# Patient Record
Sex: Female | Born: 1975 | Race: White | Hispanic: No | Marital: Married | Smoking: Never smoker
Health system: Southern US, Community
[De-identification: ages and names within clinical notes are randomized; demographics above are authoritative.]

## PROBLEM LIST (undated history)

## (undated) DIAGNOSIS — F32A Depression, unspecified: Secondary | ICD-10-CM

## (undated) DIAGNOSIS — R7611 Nonspecific reaction to tuberculin skin test without active tuberculosis: Secondary | ICD-10-CM

## (undated) DIAGNOSIS — F419 Anxiety disorder, unspecified: Secondary | ICD-10-CM

## (undated) DIAGNOSIS — T7840XA Allergy, unspecified, initial encounter: Secondary | ICD-10-CM

## (undated) DIAGNOSIS — F329 Major depressive disorder, single episode, unspecified: Secondary | ICD-10-CM

## (undated) DIAGNOSIS — K589 Irritable bowel syndrome without diarrhea: Secondary | ICD-10-CM

## (undated) DIAGNOSIS — U071 COVID-19: Secondary | ICD-10-CM

## (undated) DIAGNOSIS — O1490 Unspecified pre-eclampsia, unspecified trimester: Secondary | ICD-10-CM

## (undated) DIAGNOSIS — Q059 Spina bifida, unspecified: Secondary | ICD-10-CM

## (undated) DIAGNOSIS — E162 Hypoglycemia, unspecified: Secondary | ICD-10-CM

## (undated) DIAGNOSIS — K219 Gastro-esophageal reflux disease without esophagitis: Secondary | ICD-10-CM

## (undated) DIAGNOSIS — R7612 Nonspecific reaction to cell mediated immunity measurement of gamma interferon antigen response without active tuberculosis: Secondary | ICD-10-CM

## (undated) DIAGNOSIS — J45909 Unspecified asthma, uncomplicated: Secondary | ICD-10-CM

## (undated) DIAGNOSIS — E785 Hyperlipidemia, unspecified: Secondary | ICD-10-CM

## (undated) HISTORY — DX: Depression, unspecified: F32.A

## (undated) HISTORY — DX: Unspecified pre-eclampsia, unspecified trimester: O14.90

## (undated) HISTORY — DX: Gastro-esophageal reflux disease without esophagitis: K21.9

## (undated) HISTORY — DX: Hypoglycemia, unspecified: E16.2

## (undated) HISTORY — DX: Allergy, unspecified, initial encounter: T78.40XA

## (undated) HISTORY — DX: Anxiety disorder, unspecified: F41.9

## (undated) HISTORY — DX: Unspecified asthma, uncomplicated: J45.909

## (undated) HISTORY — DX: Hyperlipidemia, unspecified: E78.5

## (undated) HISTORY — DX: Nonspecific reaction to cell mediated immunity measurement of gamma interferon antigen response without active tuberculosis: R76.12

## (undated) HISTORY — DX: Major depressive disorder, single episode, unspecified: F32.9

## (undated) HISTORY — PX: OTHER SURGICAL HISTORY: SHX169

## (undated) HISTORY — PX: DILATION AND CURETTAGE OF UTERUS: SHX78

## (undated) HISTORY — DX: COVID-19: U07.1

---

## 1998-03-23 ENCOUNTER — Ambulatory Visit (HOSPITAL_COMMUNITY): Admission: RE | Admit: 1998-03-23 | Discharge: 1998-03-23 | Payer: Self-pay | Admitting: Orthopaedic Surgery

## 2002-06-13 HISTORY — PX: DILATION AND CURETTAGE OF UTERUS: SHX78

## 2003-09-24 ENCOUNTER — Ambulatory Visit (HOSPITAL_COMMUNITY): Admission: RE | Admit: 2003-09-24 | Discharge: 2003-09-24 | Payer: Self-pay | Admitting: Obstetrics and Gynecology

## 2003-11-20 ENCOUNTER — Other Ambulatory Visit: Admission: RE | Admit: 2003-11-20 | Discharge: 2003-11-20 | Payer: Self-pay | Admitting: Obstetrics and Gynecology

## 2004-04-02 ENCOUNTER — Inpatient Hospital Stay (HOSPITAL_COMMUNITY): Admission: AD | Admit: 2004-04-02 | Discharge: 2004-04-03 | Payer: Self-pay | Admitting: Obstetrics and Gynecology

## 2004-05-14 ENCOUNTER — Inpatient Hospital Stay (HOSPITAL_COMMUNITY): Admission: AD | Admit: 2004-05-14 | Discharge: 2004-05-14 | Payer: Self-pay | Admitting: Obstetrics and Gynecology

## 2004-05-19 ENCOUNTER — Inpatient Hospital Stay (HOSPITAL_COMMUNITY): Admission: AD | Admit: 2004-05-19 | Discharge: 2004-05-22 | Payer: Self-pay | Admitting: Obstetrics and Gynecology

## 2004-05-19 ENCOUNTER — Encounter (INDEPENDENT_AMBULATORY_CARE_PROVIDER_SITE_OTHER): Payer: Self-pay | Admitting: Specialist

## 2004-05-23 ENCOUNTER — Encounter: Admission: RE | Admit: 2004-05-23 | Discharge: 2004-05-23 | Payer: Self-pay | Admitting: Obstetrics and Gynecology

## 2004-12-12 ENCOUNTER — Other Ambulatory Visit: Admission: RE | Admit: 2004-12-12 | Discharge: 2004-12-12 | Payer: Self-pay | Admitting: Obstetrics and Gynecology

## 2006-01-22 ENCOUNTER — Other Ambulatory Visit: Admission: RE | Admit: 2006-01-22 | Discharge: 2006-01-22 | Payer: Self-pay | Admitting: Obstetrics and Gynecology

## 2006-06-19 HISTORY — PX: OTHER SURGICAL HISTORY: SHX169

## 2010-04-12 ENCOUNTER — Encounter (INDEPENDENT_AMBULATORY_CARE_PROVIDER_SITE_OTHER): Payer: Self-pay | Admitting: Obstetrics and Gynecology

## 2010-04-12 ENCOUNTER — Inpatient Hospital Stay (HOSPITAL_COMMUNITY): Admission: AD | Admit: 2010-04-12 | Discharge: 2010-04-14 | Payer: Self-pay | Admitting: Obstetrics and Gynecology

## 2010-08-31 LAB — CBC
HCT: 32.3 % — ABNORMAL LOW (ref 36.0–46.0)
HCT: 33.5 % — ABNORMAL LOW (ref 36.0–46.0)
Hemoglobin: 10.9 g/dL — ABNORMAL LOW (ref 12.0–15.0)
Hemoglobin: 11.2 g/dL — ABNORMAL LOW (ref 12.0–15.0)
MCH: 27.3 pg (ref 26.0–34.0)
MCHC: 33.5 g/dL (ref 30.0–36.0)
MCHC: 33.6 g/dL (ref 30.0–36.0)
MCV: 81.4 fL (ref 78.0–100.0)
Platelets: 240 10*3/uL (ref 150–400)
RBC: 3.91 MIL/uL (ref 3.87–5.11)
RBC: 4.12 MIL/uL (ref 3.87–5.11)
RDW: 13.8 % (ref 11.5–15.5)
WBC: 13.6 10*3/uL — ABNORMAL HIGH (ref 4.0–10.5)

## 2010-08-31 LAB — RPR: RPR Ser Ql: NONREACTIVE

## 2010-11-04 NOTE — Discharge Summary (Signed)
Veronica [name], Veronica [name]              ACCOUNT NO.:  [id number]   MEDICAL RECORD NO.:  [id number]          PATIENT TYPE:  INP   LOCATION:  9110                          FACILITY:  [facility]   PHYSICIAN:  Huel Cote, M.D. DATE OF BIRTH:  21-Apr-1976   DATE OF ADMISSION:  05/19/2004  DATE OF DISCHARGE:  05/22/2004                                 DISCHARGE SUMMARY   DISCHARGE DIAGNOSES:  1.  Term pregnancy at 39 weeks, delivered.  2.  Preeclampsia.  3.  Status post magnesium prophylaxis.  4.  Anemia.  5.  Status post vacuum-assisted vaginal delivery.   DISCHARGE MEDICATIONS:  1.  Motrin 600 mg p.o. q.6h.  2.  Percocet one to two tablets p.o. q.4h. p.r.n.  3.  Niferex one p.o. daily.   DISCHARGE FOLLOW-UP:  The patient is to get her blood pressure checked in  the week following discharge, and return to the office for her visit in 6  weeks for her postpartum exam.   HOSPITAL COURSE:  The patient was a 35 year old G2 P0 who was admitted with  ruptured membranes and irregular contractions.  Prenatal care had been  complicated by nausea/vomiting which was treated with Zofran; otherwise, had  been uneventful.  Prenatal labs are as follows:  A positive, antibody  negative, RPR nonreactive, rubella immune, hepatitis B surface antigen  negative, HIV declined, GC negative, chlamydia negative, triple screen  slightly elevated with a Down's syndrome risk of 1 in 270, 1-hour Glucola  was 131.  She was then admitted as stated with ruptured membranes.  Past  medical history:  None.  Past surgical history;  D&C for SAB.  Past  obstetrical history:  The spontaneous miscarriage as stated.  Past GYN  history:  No abnormal Pap smears.  Allergies included SULFA.  On admission,  her blood pressures were noted to be elevated in 130s to 140s over 90s to  104.  Fetal heart rate was reactive.  Cervix was 2 cm, 70%, and a -2  station.  PIH labs were obtained and the patient was placed on IV magnesium  for  seizure prophylaxis, given a presumed diagnosis of preeclampsia with her  brisk reflexes.  She progressed slowing and eventually reached complete  dilation and made slow progress with pushing.  Vertex changed from LOT to  LOA and after 3 hours of pushing, vacuum-assisted vaginal delivery was  performed and the infant was delivered LOA over a mediolateral episiotomy.  There was a nuchal cord which was reduced.  The mediolateral episiotomy was  repaired with 2-0 and 3-0 Vicryl.  Estimated blood loss was 600+ mL.  The  female infant was 5 pounds 13 ounces with Apgars of 7 and 9 and the cord pH  was 7.18.  The patient then was admitted to the [facility] where she was continued  on her magnesium.  She was slightly dizzy with standing and had good urine  output.  Blood pressure immediately following delivery was 130s over 80s and  she began to diurese by the end of postpartum day #1; therefore, her  magnesium was discontinued.  Her hemoglobin went  from 12.9 to 8.8 on  postpartum day #1.  She did have some dizziness with standing.  By  postpartum day #2, her magnesium was off and she began to feel better.  Her  hemoglobin did drop to 7.2; however, she was able to ambulate without  dizziness off  the magnesium, her pain was controlled with Percocet, and her blood pressure  was stable in the 130s over 80s throughout her postpartum course.  Therefore, she was felt stable for discharge home and would follow up her  blood pressure as previously stated.     Kath   KR/MEDQ  D:  06/13/2004  T:  06/13/2004  Job:  [id number]

## 2016-06-06 ENCOUNTER — Encounter: Payer: Self-pay | Admitting: Family Medicine

## 2016-06-06 ENCOUNTER — Encounter (INDEPENDENT_AMBULATORY_CARE_PROVIDER_SITE_OTHER): Payer: Self-pay

## 2016-06-06 ENCOUNTER — Ambulatory Visit (INDEPENDENT_AMBULATORY_CARE_PROVIDER_SITE_OTHER): Payer: BC Managed Care – PPO | Admitting: Family Medicine

## 2016-06-06 VITALS — BP 101/69 | HR 81 | Temp 98.2°F | Resp 12 | Ht 60.5 in | Wt 121.0 lb

## 2016-06-06 DIAGNOSIS — Z23 Encounter for immunization: Secondary | ICD-10-CM

## 2016-06-06 DIAGNOSIS — Z0000 Encounter for general adult medical examination without abnormal findings: Secondary | ICD-10-CM | POA: Diagnosis not present

## 2016-06-06 DIAGNOSIS — F419 Anxiety disorder, unspecified: Secondary | ICD-10-CM

## 2016-06-06 DIAGNOSIS — T7840XA Allergy, unspecified, initial encounter: Secondary | ICD-10-CM | POA: Insufficient documentation

## 2016-06-06 HISTORY — DX: Anxiety disorder, unspecified: F41.9

## 2016-06-06 MED ORDER — MOMETASONE FUROATE 50 MCG/ACT NA SUSP: NASAL | 6 refills | Status: DC

## 2016-06-06 MED ORDER — SERTRALINE HCL 50 MG PO TABS: 50.0000 mg | ORAL_TABLET | Freq: Every day | ORAL | 3 refills | Status: DC

## 2016-06-06 NOTE — Progress Notes (Signed)
Subjective:  Patient ID: Veronica [name], female    DOB: 07/25/1975  Age: 40 y.o. MRN: [id number]  CC: Establish care/physical.   HPI Veronica [name] is a 40 y.o. female presents to the clinic today to establish care.  Preventative Healthcare  Pap smear: Up to date.  Mammogram: In need of in 2018.  Colonoscopy: Not yet indicated.  Immunizations  Tetanus - Up to date.   Flu - Desires today.  Labs: Discussed screening labs.  Exercise: No regular exercise.   Alcohol use: See below.  Smoking/tobacco use: No.  STD/HIV testing: Has had testing.  PMH, Surgical Hx, Family Hx, Social History reviewed and updated as below.  Past Medical History:  Diagnosis Date  . Allergy   . Anxiety 06/06/2016  . Asthma   . GERD (gastroesophageal reflux disease)   . Hyperlipidemia   . Hypoglycemia    Past Surgical History:  Procedure Laterality Date  . DILATION AND CURETTAGE OF UTERUS    . vocal cord surgery     Family History  Problem Relation Age of Onset  . Hyperlipidemia Mother   . Heart disease Father   . Hypertension Father   . Diabetes Father   . Arthritis Maternal Grandmother   . Stroke Maternal Grandmother   . Hyperlipidemia Maternal Grandmother   . Hypertension Maternal Grandfather   . Colon cancer Paternal Grandmother   . Hypertension Paternal Grandfather   . Diabetes Paternal Grandfather    Social History  Substance Use Topics  . Smoking status: Never Smoker  . Smokeless tobacco: Never Used  . Alcohol use 1.2 oz/week    2 Standard drinks or equivalent per week   Review of Systems  Neurological: Positive for headaches.  Psychiatric/Behavioral:       Anxiety, stress.  All other systems reviewed and are negative.  Objective:   Today's Vitals: BP 101/69 (BP Location: Left Arm, Patient Position: Sitting, Cuff Size: Normal)   Pulse 81   Temp 98.2 F (36.8 C) (Oral)   Resp 12   Ht 5' 0.5" (1.537 m)   Wt 121 lb (54.9 kg)   SpO2 99%   BMI 23.24 kg/m     Physical Exam  Constitutional: She is oriented to person, place, and time. She appears well-developed and well-nourished. No distress.  HENT:  Head: Normocephalic and atraumatic.  Nose: Nose normal.  Mouth/Throat: Oropharynx is clear and moist. No oropharyngeal exudate.  Normal TM's bilaterally.   Eyes: Conjunctivae are normal. No scleral icterus.  Neck: Neck supple.  Cardiovascular: Normal rate and regular rhythm.   No murmur heard. Pulmonary/Chest: Effort normal and breath sounds normal. She has no wheezes. She has no rales.  Abdominal: Soft. She exhibits no distension. There is no tenderness. There is no rebound and no guarding.  Musculoskeletal: Normal range of motion. She exhibits no edema.  Lymphadenopathy:    She has no cervical adenopathy.  Neurological: She is alert and oriented to person, place, and time.  Skin: Skin is warm and dry. No rash noted.  Psychiatric: She has a normal mood and affect.  Vitals reviewed.  Assessment & Plan:   Problem List Items Addressed This Visit    Annual physical exam - Primary    HIV screening done. Flu vaccine today. Pap smear up-to-date. Mammogram next year. Needs labs in the near future. Mirena for contraception.       Other Visit Diagnoses    Encounter for immunization       Relevant Orders  Flu Vaccine QUAD 36+ mos IM (Completed)      Outpatient Encounter Prescriptions as of 06/06/2016  Medication Sig  . albuterol (PROVENTIL HFA;VENTOLIN HFA) 108 (90 Base) MCG/ACT inhaler Inhale into the lungs.  . Clindamycin-Benzoyl Per, Refr, gel Apply topically 2 (two) times daily.  . sertraline (ZOLOFT) 50 MG tablet Take 1 tablet (50 mg total) by mouth daily.  . [DISCONTINUED] fluticasone (FLONASE) 50 MCG/ACT nasal spray Place into the nose.  . [DISCONTINUED] sertraline (ZOLOFT) 50 MG tablet   . mometasone (NASONEX) 50 MCG/ACT nasal spray 2 sprays in each nostril daily.   No facility-administered encounter medications on file as  of 06/06/2016.     Follow-up: Annually  Everlene OtherJayce Arlet Marter DO [facility] [place]

## 2016-06-06 NOTE — Assessment & Plan Note (Signed)
HIV screening done. Flu vaccine today. Pap smear up-to-date. Mammogram next year. Needs labs in the near future. Mirena for contraception.

## 2016-06-06 NOTE — Patient Instructions (Signed)
Continue your meds.  Follow up annually.  Take care  Dr. Ammiel Guiney  Health Maintenance, Female Introduction Adopting a healthy lifestyle and getting preventive care can go a long way to promote health and wellness. Talk with your health care provider about what schedule of regular examinations is right for you. This is a good chance for you to check in with your provider about disease prevention and staying healthy. In between checkups, there are plenty of things you can do on your own. Experts have done a lot of research about which lifestyle changes and preventive measures are most likely to keep you healthy. Ask your health care provider for more information. Weight and diet Eat a healthy diet  Be sure to include plenty of vegetables, fruits, low-fat dairy products, and lean protein.  Do not eat a lot of foods high in solid fats, added sugars, or salt.  Get regular exercise. This is one of the most important things you can do for your health.  Most adults should exercise for at least 150 minutes each week. The exercise should increase your heart rate and make you sweat (moderate-intensity exercise).  Most adults should also do strengthening exercises at least twice a week. This is in addition to the moderate-intensity exercise. Maintain a healthy weight  Body mass index (BMI) is a measurement that can be used to identify possible weight problems. It estimates body fat based on height and weight. Your health care provider can help determine your BMI and help you achieve or maintain a healthy weight.  For females 20 years of age and older:  A BMI below 18.5 is considered underweight.  A BMI of 18.5 to 24.9 is normal.  A BMI of 25 to 29.9 is considered overweight.  A BMI of 30 and above is considered obese. Watch levels of cholesterol and blood lipids  You should start having your blood tested for lipids and cholesterol at 39 years of age, then have this test every 5 years.  You  may need to have your cholesterol levels checked more often if:  Your lipid or cholesterol levels are high.  You are older than 39 years of age.  You are at high risk for heart disease. Cancer screening Lung Cancer  Lung cancer screening is recommended for adults 55-80 years old who are at high risk for lung cancer because of a history of smoking.  A yearly low-dose CT scan of the lungs is recommended for people who:  Currently smoke.  Have quit within the past 15 years.  Have at least a 30-pack-year history of smoking. A pack year is smoking an average of one pack of cigarettes a day for 1 year.  Yearly screening should continue until it has been 15 years since you quit.  Yearly screening should stop if you develop a health problem that would prevent you from having lung cancer treatment. Breast Cancer  Practice breast self-awareness. This means understanding how your breasts normally appear and feel.  It also means doing regular breast self-exams. Let your health care provider know about any changes, no matter how small.  If you are in your 20s or 30s, you should have a clinical breast exam (CBE) by a health care provider every 1-3 years as part of a regular health exam.  If you are 40 or older, have a CBE every year. Also consider having a breast X-ray (mammogram) every year.  If you have a family history of breast cancer, talk to your health care   care provider about genetic screening.  If you are at high risk for breast cancer, talk to your health care provider about having an MRI and a mammogram every year.  Breast cancer gene (BRCA) assessment is recommended for women who have family members with BRCA-related cancers. BRCA-related cancers include:  Breast.  Ovarian.  Tubal.  Peritoneal cancers.  Results of the assessment will determine the need for genetic counseling and BRCA1 and BRCA2 testing. Cervical Cancer  Your health care provider may recommend that you be  screened regularly for cancer of the pelvic organs (ovaries, uterus, and vagina). This screening involves a pelvic examination, including checking for microscopic changes to the surface of your cervix (Pap test). You may be encouraged to have this screening done every 3 years, beginning at age 27.  For women ages 18-65, health care providers may recommend pelvic exams and Pap testing every 3 years, or they may recommend the Pap and pelvic exam, combined with testing for human papilloma virus (HPV), every 5 years. Some types of HPV increase your risk of cervical cancer. Testing for HPV may also be done on women of any age with unclear Pap test results.  Other health care providers may not recommend any screening for nonpregnant women who are considered low risk for pelvic cancer and who do not have symptoms. Ask your health care provider if a screening pelvic exam is right for you.  If you have had past treatment for cervical cancer or a condition that could lead to cancer, you need Pap tests and screening for cancer for at least 20 years after your treatment. If Pap tests have been discontinued, your risk factors (such as having a new sexual partner) need to be reassessed to determine if screening should resume. Some women have medical problems that increase the chance of getting cervical cancer. In these cases, your health care provider may recommend more frequent screening and Pap tests. Colorectal Cancer  This type of cancer can be detected and often prevented.  Routine colorectal cancer screening usually begins at 39 years of age and continues through 39 years of age.  Your health care provider may recommend screening at an earlier age if you have risk factors for colon cancer.  Your health care provider may also recommend using home test kits to check for hidden blood in the stool.  A small camera at the end of a tube can be used to examine your colon directly (sigmoidoscopy or colonoscopy).  This is done to check for the earliest forms of colorectal cancer.  Routine screening usually begins at age 55.  Direct examination of the colon should be repeated every 5-10 years through 39 years of age. However, you may need to be screened more often if early forms of precancerous polyps or small growths are found. Skin Cancer  Check your skin from head to toe regularly.  Tell your health care provider about any new moles or changes in moles, especially if there is a change in a mole's shape or color.  Also tell your health care provider if you have a mole that is larger than the size of a pencil eraser.  Always use sunscreen. Apply sunscreen liberally and repeatedly throughout the day.  Protect yourself by wearing long sleeves, pants, a wide-brimmed hat, and sunglasses whenever you are outside. Heart disease, diabetes, and high blood pressure  High blood pressure causes heart disease and increases the risk of stroke. High blood pressure is more likely to develop in:  People  who have blood pressure in the high end of the normal range (130-139/85-89 mm Hg).  People who are overweight or obese.  People who are African American.  If you are 36-52 years of age, have your blood pressure checked every 3-5 years. If you are 1 years of age or older, have your blood pressure checked every year. You should have your blood pressure measured twice-once when you are at a hospital or clinic, and once when you are not at a hospital or clinic. Record the average of the two measurements. To check your blood pressure when you are not at a hospital or clinic, you can use:  An automated blood pressure machine at a pharmacy.  A home blood pressure monitor.  If you are between 53 years and 1 years old, ask your health care provider if you should take aspirin to prevent strokes.  Have regular diabetes screenings. This involves taking a blood sample to check your fasting blood sugar level.  If you  are at a normal weight and have a low risk for diabetes, have this test once every three years after 39 years of age.  If you are overweight and have a high risk for diabetes, consider being tested at a younger age or more often. Preventing infection Hepatitis B  If you have a higher risk for hepatitis B, you should be screened for this virus. You are considered at high risk for hepatitis B if:  You were born in a country where hepatitis B is common. Ask your health care provider which countries are considered high risk.  Your parents were born in a high-risk country, and you have not been immunized against hepatitis B (hepatitis B vaccine).  You have HIV or AIDS.  You use needles to inject street drugs.  You live with someone who has hepatitis B.  You have had sex with someone who has hepatitis B.  You get hemodialysis treatment.  You take certain medicines for conditions, including cancer, organ transplantation, and autoimmune conditions. Hepatitis C  Blood testing is recommended for:  Everyone born from [birth year] through 1964.  Anyone with known risk factors for hepatitis C. Sexually transmitted infections (STIs)  You should be screened for sexually transmitted infections (STIs) including gonorrhea and chlamydia if:  You are sexually active and are younger than 39 years of age.  You are older than 39 years of age and your health care provider tells you that you are at risk for this type of infection.  Your sexual activity has changed since you were last screened and you are at an increased risk for chlamydia or gonorrhea. Ask your health care provider if you are at risk.  If you do not have HIV, but are at risk, it may be recommended that you take a prescription medicine daily to prevent HIV infection. This is called pre-exposure prophylaxis (PrEP). You are considered at risk if:  You are sexually active and do not regularly use condoms or know the HIV status of your  partner(s).  You take drugs by injection.  You are sexually active with a partner who has HIV. Talk with your health care provider about whether you are at high risk of being infected with HIV. If you choose to begin PrEP, you should first be tested for HIV. You should then be tested every 3 months for as long as you are taking PrEP. Pregnancy  If you are premenopausal and you may become pregnant, ask your health care provider about preconception  counseling.  If you may become pregnant, take 400 to 800 micrograms (mcg) of folic acid every day.  If you want to prevent pregnancy, talk to your health care provider about birth control (contraception). Osteoporosis and menopause  Osteoporosis is a disease in which the bones lose minerals and strength with aging. This can result in serious bone fractures. Your risk for osteoporosis can be identified using a bone density scan.  If you are 40 years of age or older, or if you are at risk for osteoporosis and fractures, ask your health care provider if you should be screened.  Ask your health care provider whether you should take a calcium or vitamin D supplement to lower your risk for osteoporosis.  Menopause may have certain physical symptoms and risks.  Hormone replacement therapy may reduce some of these symptoms and risks. Talk to your health care provider about whether hormone replacement therapy is right for you. Follow these instructions at home:  Schedule regular health, dental, and eye exams.  Stay current with your immunizations.  Do not use any tobacco products including cigarettes, chewing tobacco, or electronic cigarettes.  If you are pregnant, do not drink alcohol.  If you are breastfeeding, limit how much and how often you drink alcohol.  Limit alcohol intake to no more than 1 drink per day for nonpregnant women. One drink equals 12 ounces of beer, 5 ounces of wine, or 1 ounces of hard liquor.  Do not use street  drugs.  Do not share needles.  Ask your health care provider for help if you need support or information about quitting drugs.  Tell your health care provider if you often feel depressed.  Tell your health care provider if you have ever been abused or do not feel safe at home. This information is not intended to replace advice given to you by your health care provider. Make sure you discuss any questions you have with your health care provider. Document Released: 12/19/2010 Document Revised: 11/11/2015 Document Reviewed: 03/09/2015  2017 [employer]

## 2016-06-06 NOTE — Progress Notes (Signed)
Pre visit review using our clinic review tool, if applicable. No additional management support is needed unless otherwise documented below in the visit note. 

## 2016-07-01 ENCOUNTER — Ambulatory Visit
Admission: EM | Admit: 2016-07-01 | Discharge: 2016-07-01 | Disposition: A | Discharge status: Home / Self Care | Payer: BC Managed Care – PPO | Attending: Emergency Medicine | Admitting: Emergency Medicine

## 2016-07-01 DIAGNOSIS — J029 Acute pharyngitis, unspecified: Secondary | ICD-10-CM | POA: Diagnosis not present

## 2016-07-01 DIAGNOSIS — J069 Acute upper respiratory infection, unspecified: Secondary | ICD-10-CM

## 2016-07-01 LAB — RAPID STREP SCREEN (MED CTR MEBANE ONLY): STREPTOCOCCUS, GROUP A SCREEN (DIRECT): NEGATIVE

## 2016-07-01 LAB — RAPID INFLUENZA A&B ANTIGENS (ARMC ONLY): INFLUENZA A (ARMC): NEGATIVE

## 2016-07-01 LAB — RAPID INFLUENZA A&B ANTIGENS: Influenza B (ARMC): NEGATIVE

## 2016-07-01 NOTE — ED Provider Notes (Signed)
HPI  SUBJECTIVE:  Patient reports sore throat starting yesterday. Sx worse with swallowing.  Sx better with nothing. Has not tried anything other than rest for this. No fevers    + Cough/URI sxs-reports some postnasal drip and occasional dry cough. She is using her Nasonex. Reports bilateral ear fullness, but no ear pain + Mild Myalgias + Headache yesterday, resolved today No Rash + fatigue     + Recent Strep Exposure. child currently with strep No Abdominal Pain No reflux sxs No Allergy sxs  No Breathing difficulty, voice changes No Drooling No Trismus No abx in past month.  No antipyretic in past 4-6 hrs She has a past medical history seasonal allergies, sinusitis, recurrent strep pharyngitis, hypoglycemia and asthma. No history of mono, diabetes, hypertension. LMP: Amenorrheic. Patient has Mirena. Denies possibility of being pregnant. ZOX:WRUEAPCP:Jayce Berton LanG Cook, DO    Past Medical History:  Diagnosis Date  . Allergy   . Anxiety 06/06/2016  . Asthma   . GERD (gastroesophageal reflux disease)   . Hyperlipidemia   . Hypoglycemia     Past Surgical History:  Procedure Laterality Date  . DILATION AND CURETTAGE OF UTERUS    . vocal cord surgery      Family History  Problem Relation Age of Onset  . Hyperlipidemia Mother   . Heart disease Father   . Hypertension Father   . Diabetes Father   . Arthritis Maternal Grandmother   . Stroke Maternal Grandmother   . Hyperlipidemia Maternal Grandmother   . Hypertension Maternal Grandfather   . Colon cancer Paternal Grandmother   . Hypertension Paternal Grandfather   . Diabetes Paternal Grandfather     Social History  Substance Use Topics  . Smoking status: Never Smoker  . Smokeless tobacco: Never Used  . Alcohol use 1.2 oz/week    2 Standard drinks or equivalent per week     Comment: social    No current facility-administered medications for this encounter.   Current Outpatient Prescriptions:  .  levonorgestrel  (MIRENA) 20 MCG/24HR IUD, 1 each by Intrauterine route once., Disp: , Rfl:  .  albuterol (PROVENTIL HFA;VENTOLIN HFA) 108 (90 Base) MCG/ACT inhaler, Inhale into the lungs., Disp: , Rfl:  .  Clindamycin-Benzoyl Per, Refr, gel, Apply topically 2 (two) times daily., Disp: , Rfl:  .  mometasone (NASONEX) 50 MCG/ACT nasal spray, 2 sprays in each nostril daily., Disp: 17 g, Rfl: 6 .  sertraline (ZOLOFT) 50 MG tablet, Take 1 tablet (50 mg total) by mouth daily., Disp: 90 tablet, Rfl: 3  Allergies  Allergen Reactions  . Pseudoephedrine Hcl Palpitations  . Sulfa Antibiotics Rash     ROS  As noted in HPI.   Physical Exam  BP 117/74 (BP Location: Left Arm)   Pulse 81   Temp 98 F (36.7 C)   Ht 5\' 1"  (1.549 m)   Wt 116 lb (52.6 kg)   SpO2 100%   BMI 21.92 kg/m   Constitutional: Well developed, well nourished, no acute distress Eyes:  EOMI, conjunctiva normal bilaterally HENT: Normocephalic, atraumatic,mucus membranes moist.TMs normal bilaterally + minimal nasal congestion erythematous, but not swollen turbinates. No sinus tenderness. + Slightly erythematous oropharynx - enlarged tonsils - exudates. Uvula midline.  Respiratory: Normal inspiratory effort Cardiovascular: Normal rate, no murmurs, rubs, gallops GI: nondistended, nontender. No appreciable splenomegaly skin: No rash, skin intact Lymph: -  cervical LN  Musculoskeletal: no deformities Neurologic: Alert & oriented x 3, no focal neuro deficits Psychiatric: Speech and behavior appropriate. At baseline  mental status per caregiver.   ED Course   Medications - No data to display  Orders Placed This Encounter  Procedures  . Rapid strep screen    Standing Status:   Standing    Number of Occurrences:   1  . Culture, group A strep    Standing Status:   Standing    Number of Occurrences:   1  . Rapid Influenza A&B Antigens ([facility] only)    Standing Status:   Standing    Number of Occurrences:   1  . Droplet precaution     Standing Status:   Standing    Number of Occurrences:   1    Results for orders placed or performed during the hospital encounter of 07/01/16 (from the past 24 hour(s))  Rapid strep screen     Status: None   Collection Time: 07/01/16  3:25 PM  Result Value Ref Range   Streptococcus, Group A Screen (Direct) NEGATIVE NEGATIVE  Rapid Influenza A&B Antigens ([facility] only)     Status: None   Collection Time: 07/01/16  4:16 PM  Result Value Ref Range   Influenza A ([facility]) NEGATIVE NEGATIVE   Influenza B ([facility]) NEGATIVE NEGATIVE   No results found.  ED Clinical Impression  Sore throat  Upper respiratory tract infection, unspecified type   ED Assessment/Plan  Rapid strep and influenza negative. Obtaining throat culture to guide antibiotic treatment. Centor score 0/4. Discussed this with patient. We'll contact them if culture is positive, and will call in Appropriate antibiotics. Patient home with 600 mg ibuprofen, Tylenol, Benadryl/Maalox mixture. Patient declined prescription of ibuprofen- states that she has plenty at home. Patient to followup with PMD when necessary,   Discussed labs,  MDM, plan and followup with patient. Discussed sn/sx that should prompt return to the  ED. Patient  agrees with plan.   Meds ordered this encounter  Medications  . levonorgestrel (MIRENA) 20 MCG/24HR IUD    Sig: 1 each by Intrauterine route once.     *This clinic note was created using Dragon dictation software. Therefore, there may be occasional mistakes despite careful proofreading.    Domenick Gong, MD 07/01/16 [phone]

## 2016-07-01 NOTE — Discharge Instructions (Signed)
your rapid strep was negative today, so we have sent off a throat culture.  We will contact you and call in the appropriate antibiotics if your culture comes back positive for an infection requiring antibiotic treatment.  Give us a working phone number.  1 gram of  Tylenol and 600 mg ibuprofen together as needed for pain.  Make sure you drink plenty of extra fluids.  Some people find salt water gargles and  Traditional Medicinal's "Throat Coat" tea helpful. Take 5 mL of liquid Benadryl and 5 mL of Maalox. Mix it together, and then hold it in your mouth for as long as you can and then swallow. You may do this 4 times a day.    Go to www.goodrx.com to look up your medications. This will give you a list of where you can find your prescriptions at the most affordable prices.

## 2016-07-01 NOTE — ED Triage Notes (Signed)
Pt reports her child was diagnosed with strep on Wednesday and now she has low energy and sore throat pain 5/10

## 2016-07-04 LAB — CULTURE, GROUP A STREP (THRC)

## 2016-07-17 LAB — HM PAP SMEAR: HM Pap smear: NEGATIVE

## 2017-01-30 ENCOUNTER — Telehealth: Payer: Self-pay | Admitting: *Deleted

## 2017-01-30 NOTE — Telephone Encounter (Signed)
Pt has requested to know if medical records were received from [facility]

## 2017-01-30 NOTE — Telephone Encounter (Signed)
Patient advised that we haven't received records.

## 2017-01-30 NOTE — Telephone Encounter (Signed)
Not that I am aware of.

## 2017-02-06 ENCOUNTER — Ambulatory Visit (INDEPENDENT_AMBULATORY_CARE_PROVIDER_SITE_OTHER): Payer: BC Managed Care – PPO | Admitting: Family Medicine

## 2017-02-06 ENCOUNTER — Other Ambulatory Visit: Payer: Self-pay

## 2017-02-06 ENCOUNTER — Encounter: Payer: Self-pay | Admitting: Family Medicine

## 2017-02-06 VITALS — BP 110/76 | HR 78 | Temp 98.6°F | Resp 16 | Wt 123.5 lb

## 2017-02-06 DIAGNOSIS — Z0000 Encounter for general adult medical examination without abnormal findings: Secondary | ICD-10-CM

## 2017-02-06 MED ORDER — CLINDAMYCIN PHOS-BENZOYL PEROX 1.2-5 % EX GEL: CUTANEOUS | 3 refills | Status: DC

## 2017-02-07 ENCOUNTER — Telehealth: Payer: Self-pay | Admitting: Family Medicine

## 2017-02-07 NOTE — Progress Notes (Signed)
Patient needed form to be filled out. Did not need office visit. Visit cancelled.

## 2017-02-07 NOTE — Telephone Encounter (Signed)
Pt dropped off immunization paper to be completed by Dr. Adriana Simas. Papers are in envelope up front in color folder

## 2017-02-07 NOTE — Telephone Encounter (Signed)
Contacted patient and she will try to find documentation for hep B injections

## 2017-02-08 NOTE — Telephone Encounter (Signed)
Pt called back and stated that she got confirmation from her previous doctor's office that there is no record of her having a hep b.

## 2017-02-09 NOTE — Telephone Encounter (Signed)
Can you please place orders for titers for labs paperwork in form folder.  It looks like patient will need to repeat series for hepatitis B series. Thanks

## 2017-02-09 NOTE — Telephone Encounter (Signed)
Just needs Hep b series.

## 2017-02-12 NOTE — Telephone Encounter (Signed)
Appointment scheduled paperwork in Dr Trans[employer] folder

## 2017-02-15 ENCOUNTER — Ambulatory Visit (INDEPENDENT_AMBULATORY_CARE_PROVIDER_SITE_OTHER): Payer: BC Managed Care – PPO

## 2017-02-15 DIAGNOSIS — Z23 Encounter for immunization: Secondary | ICD-10-CM | POA: Diagnosis not present

## 2017-02-15 NOTE — Progress Notes (Signed)
Patient comes in for Hepatitis B injection .  Injected left deltoid . Patient tolerated injection well.

## 2017-02-16 NOTE — Telephone Encounter (Signed)
Patient came in for Hep B on 02/15/17 Will you please sign form placed in form folder  ? Thanks

## 2017-03-01 ENCOUNTER — Ambulatory Visit (INDEPENDENT_AMBULATORY_CARE_PROVIDER_SITE_OTHER): Payer: BC Managed Care – PPO | Admitting: Family Medicine

## 2017-03-01 DIAGNOSIS — J0100 Acute maxillary sinusitis, unspecified: Secondary | ICD-10-CM | POA: Diagnosis not present

## 2017-03-01 DIAGNOSIS — J019 Acute sinusitis, unspecified: Secondary | ICD-10-CM | POA: Insufficient documentation

## 2017-03-01 MED ORDER — AMOXICILLIN-POT CLAVULANATE 875-125 MG PO TABS: 1.0000 | ORAL_TABLET | Freq: Two times a day (BID) | ORAL | 0 refills | Status: DC

## 2017-03-01 NOTE — Assessment & Plan Note (Signed)
New acute problem. Treating with Augmentin. 

## 2017-03-01 NOTE — Progress Notes (Signed)
   Subjective:  Patient ID: Veronica [name], female    DOB: Feb 14, 1976  Age: 41 y.o. MRN: [id number]  CC: Sinus infection   HPI:  41 year old female presents with concerns for sinus infection.  Patient reports that she's been sick since Tuesday. She reports severe sinus pressure and congestion. Facial pain, dental pain. Reports postnasal drip. No fever. No purulent nasal discharge. She reports she been using Mucinex without improvement. Symptoms are severe. No known relieving factors. No known exacerbating factors. No other associated symptoms. No other complaints or concerns at this time.  Social Hx   Social History   Social History  . Marital status: Married    Spouse name: N/A  . Number of children: N/A  . Years of education: N/A   Social History Main Topics  . Smoking status: Never Smoker  . Smokeless tobacco: Never Used  . Alcohol use 1.2 oz/week    2 Standard drinks or equivalent per week     Comment: social  . Drug use: No  . Sexual activity: Yes    Partners: Male   Other Topics Concern  . Not on file   Social History Narrative  . No narrative on file    Review of Systems  Constitutional: Negative for chills and fever.  HENT: Positive for congestion, sinus pain and sinus pressure.    Objective:  BP 100/80 (BP Location: Left Arm, Patient Position: Sitting, Cuff Size: Normal)   Pulse 81   Temp 98.3 F (36.8 C) (Oral)   Resp 12   Wt 124 lb 12 oz (56.6 kg)   SpO2 97%   BMI 23.57 kg/m   BP/Weight 03/01/2017 02/06/2017 07/01/2016  Systolic BP 100 110 117  Diastolic BP 80 76 74  Wt. (Lbs) 124.75 123.5 116  BMI 23.57 23.34 21.92    Physical Exam  Constitutional: She is oriented to person, place, and time. She appears well-developed. No distress.  HENT:  Head: Normocephalic and atraumatic.  Mouth/Throat: Oropharynx is clear and moist.  Severe maxillary sinus tenderness to palpation.  Eyes: Conjunctivae are normal.  Neck: Neck supple.  Cardiovascular:  Normal rate and regular rhythm.   Soft 1-2/6 systolic murmur, prominent S2.  Pulmonary/Chest: Effort normal. She has no wheezes. She has no rales.  Lymphadenopathy:    She has no cervical adenopathy.  Neurological: She is alert and oriented to person, place, and time.  Psychiatric: She has a normal mood and affect.  Vitals reviewed.   Lab Results  Component Value Date   WBC 18.0 (H) 04/13/2010   HGB 10.9 (L) 04/13/2010   HCT 32.3 (L) 04/13/2010   PLT 203 04/13/2010    Assessment & Plan:   Problem List Items Addressed This Visit    Acute sinusitis    New acute problem. Treating with Augmentin.      Relevant Medications   amoxicillin-clavulanate (AUGMENTIN) 875-125 MG tablet      Meds ordered this encounter  Medications  . amoxicillin-clavulanate (AUGMENTIN) 875-125 MG tablet    Sig: Take 1 tablet by mouth 2 (two) times daily.    Dispense:  20 tablet    Refill:  0     Follow-up: PRN  Everlene OtherJayce Helios Kohlmann DO [facility] [place]

## 2017-03-01 NOTE — Patient Instructions (Signed)

## 2017-03-15 ENCOUNTER — Ambulatory Visit (INDEPENDENT_AMBULATORY_CARE_PROVIDER_SITE_OTHER): Payer: BC Managed Care – PPO

## 2017-03-15 DIAGNOSIS — Z23 Encounter for immunization: Secondary | ICD-10-CM | POA: Diagnosis not present

## 2017-03-15 NOTE — Progress Notes (Signed)
Patient presents for injection.  Hepatitis B vaccine given in right deltoid.

## 2017-06-21 ENCOUNTER — Ambulatory Visit: Payer: BC Managed Care – PPO

## 2017-07-25 ENCOUNTER — Other Ambulatory Visit: Payer: Self-pay | Admitting: Family Medicine

## 2017-08-01 ENCOUNTER — Telehealth: Payer: Self-pay | Admitting: Family Medicine

## 2017-08-01 NOTE — Telephone Encounter (Signed)
Please advise 

## 2017-08-01 NOTE — Telephone Encounter (Signed)
Copied from CRM [phone]. Topic: Quick Communication - Rx Refill/Question >> Aug 01, 2017  4:14 PM Alexander [name], Veronica B wrote: Medication: sertraline (ZOLOFT) 50 MG tablet [[id number]][[id number]]     Has the patient contacted their pharmacy? Yes.     (Agent: If no, request that the patient contact the pharmacy for the refill.)   [facility]   Agent: Please be advised that RX refills may take up to 3 business days. We ask that you follow-up with your pharmacy.

## 2017-08-02 ENCOUNTER — Other Ambulatory Visit: Payer: Self-pay | Admitting: Internal Medicine

## 2017-08-02 DIAGNOSIS — F419 Anxiety disorder, unspecified: Secondary | ICD-10-CM

## 2017-08-02 MED ORDER — SERTRALINE HCL 50 MG PO TABS: 50.0000 mg | ORAL_TABLET | Freq: Every day | ORAL | 0 refills | Status: DC

## 2017-08-02 NOTE — Telephone Encounter (Signed)
Patient has scheduled transfer of care to PCP from Dr. 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

## 2017-08-02 NOTE — Telephone Encounter (Signed)
Refilled Zoloft x 2 months

## 2017-08-03 NOTE — Telephone Encounter (Signed)
Patient notified

## 2017-08-11 ENCOUNTER — Ambulatory Visit: Payer: BC Managed Care – PPO | Admitting: Internal Medicine

## 2017-08-11 ENCOUNTER — Encounter: Payer: Self-pay | Admitting: Internal Medicine

## 2017-08-11 VITALS — BP 120/74 | HR 82 | Temp 98.3°F | Resp 16 | Wt 128.0 lb

## 2017-08-11 DIAGNOSIS — J069 Acute upper respiratory infection, unspecified: Secondary | ICD-10-CM

## 2017-08-11 DIAGNOSIS — B9789 Other viral agents as the cause of diseases classified elsewhere: Secondary | ICD-10-CM | POA: Diagnosis not present

## 2017-08-11 NOTE — Progress Notes (Signed)
   Subjective:    Patient ID: Veronica [name], female    DOB: 17-Dec-1975, 42 y.o.   MRN: [id number]  HPI  42 year old patient who has a history of allergic rhinitis and mild asthma.  She presents today with a 3-4-day history of sore throat right ear pressure dry nonproductive cough and rhinorrhea.  She was treated for a sinusitis 2 weeks ago with doxycycline.  Maintenance medication includes Nasonex.  No wheezing or albuterol use.  No fever but does complain of some increasing fatigue;  more recently has been taking Benadryl and then more recently Mucinex  Past Medical History:  Diagnosis Date  . Allergy   . Anxiety 06/06/2016  . Asthma   . GERD (gastroesophageal reflux disease)   . Hyperlipidemia   . Hypoglycemia      Review of Systems  Constitutional: Positive for activity change, appetite change and fatigue.  HENT: Positive for congestion, ear pain, sinus pressure and sore throat. Negative for dental problem, hearing loss, rhinorrhea and tinnitus.   Eyes: Negative for pain, discharge and visual disturbance.  Respiratory: Positive for cough. Negative for shortness of breath.   Cardiovascular: Negative for chest pain, palpitations and leg swelling.  Gastrointestinal: Negative for abdominal distention, abdominal pain, blood in stool, constipation, diarrhea, nausea and vomiting.  Genitourinary: Negative for difficulty urinating, dysuria, flank pain, frequency, hematuria, pelvic pain, urgency, vaginal bleeding, vaginal discharge and vaginal pain.  Musculoskeletal: Negative for arthralgias, gait problem and joint swelling.  Skin: Negative for rash.  Neurological: Negative for dizziness, syncope, speech difficulty, weakness, numbness and headaches.  Hematological: Negative for adenopathy.  Psychiatric/Behavioral: Negative for agitation, behavioral problems and dysphoric mood. The patient is not nervous/anxious.        Objective:   Physical Exam  Constitutional: She is oriented to  person, place, and time. She appears well-developed and well-nourished.  Appears unwell but in no acute distress.  Afebrile  HENT:  Head: Normocephalic.  Right Ear: External ear normal.  Left Ear: External ear normal.  Oropharynx was slightly injected.  No exudate or cervical adenopathy  Both hepatic membranes normal  No focal sinus tenderness  Eyes: Conjunctivae and EOM are normal. Pupils are equal, round, and reactive to light.  Neck: Normal range of motion. Neck supple. No thyromegaly present.  Cardiovascular: Normal rate, regular rhythm, normal heart sounds and intact distal pulses.  Pulmonary/Chest: Effort normal and breath sounds normal. No respiratory distress. She has no wheezes. She has no rales.  Abdominal: Soft. Bowel sounds are normal. She exhibits no mass. There is no tenderness.  Musculoskeletal: Normal range of motion.  Lymphadenopathy:    She has no cervical adenopathy.  Neurological: She is alert and oriented to person, place, and time.  Skin: Skin is warm and dry. No rash noted.  Psychiatric: She has a normal mood and affect. Her behavior is normal.          Assessment & Plan:   Viral URI with cough and mild pharyngitis.  Will treat symptomatically History of allergic rhinitis.  Continue maintenance Nasonex  Rogelia BogaKWIATKOWSKI,PETER FRANK

## 2017-08-11 NOTE — Patient Instructions (Addendum)
Acute bronchitis symptoms for less than 10 days are generally not helped by antibiotics.  Take over-the-counter expectorants and cough medications such as  Mucinex DM.  Call if there is no improvement in 5 to 7 days or if  you develop worsening cough, fever, or new symptoms, such as shortness of breath or chest pain.  Hydrate and Humidify  Drink enough water to keep your urine clear or pale yellow. Staying hydrated will help to thin your mucus.  Use a cool mist humidifier to keep the humidity level in your home above 50%.  Inhale steam for 10-15 minutes, 3-4 times a day or as told by your health care provider. You can do this in the bathroom while a hot shower is running.  Limit your exposure to cool or dry air. Rest

## 2017-08-16 ENCOUNTER — Ambulatory Visit: Payer: BC Managed Care – PPO | Admitting: Internal Medicine

## 2017-08-16 ENCOUNTER — Encounter: Payer: Self-pay | Admitting: Internal Medicine

## 2017-08-16 VITALS — BP 112/78 | HR 70 | Temp 97.9°F | Resp 12 | Wt 126.0 lb

## 2017-08-16 DIAGNOSIS — J0100 Acute maxillary sinusitis, unspecified: Secondary | ICD-10-CM | POA: Insufficient documentation

## 2017-08-16 MED ORDER — AMOXICILLIN 500 MG PO TABS: 1000.0000 mg | ORAL_TABLET | Freq: Two times a day (BID) | ORAL | 1 refills | Status: AC

## 2017-08-16 NOTE — Progress Notes (Signed)
Subjective:    Patient ID: Veronica [name], female    DOB: December 06, 1975, 42 y.o.   MRN: [id number]  HPI Here for persistent respiratory infection  [facility]ent to [facility] about 2 weeks ago--diagnosed with URI Got antibiotic (doxy) but couldn't tolerate with GI (stopped after 2 days) Seemed to be some better 6 days ago--feeling run down Seen and diagnosed with viral infection  Worsening since then Coughing, sneezing, nasal discharge Fever, couldn't get warm last night Achy, fatigued No SOB  Current Outpatient Medications on File Prior to Visit  Medication Sig Dispense Refill  . albuterol (PROVENTIL HFA;VENTOLIN HFA) 108 (90 Base) MCG/ACT inhaler Inhale into the lungs.    . Clindamycin-Benzoyl Per, Refr, gel Apply topically twice daily. 45 g 3  . levonorgestrel (MIRENA) 20 MCG/24HR IUD 1 each by Intrauterine route once.    . mometasone (NASONEX) 50 MCG/ACT nasal spray 2 sprays in each nostril daily. 17 g 6  . sertraline (ZOLOFT) 50 MG tablet Take 1 tablet (50 mg total) by mouth daily. 60 tablet 0   No current facility-administered medications on file prior to visit.     Allergies  Allergen Reactions  . Doxycycline Nausea And Vomiting  . Pseudoephedrine Hcl Palpitations  . Sulfa Antibiotics Rash    Past Medical History:  Diagnosis Date  . Allergy   . Anxiety 06/06/2016  . Asthma   . GERD (gastroesophageal reflux disease)   . Hyperlipidemia   . Hypoglycemia     Past Surgical History:  Procedure Laterality Date  . DILATION AND CURETTAGE OF UTERUS    . vocal cord surgery      Family History  Problem Relation Age of Onset  . Hyperlipidemia Mother   . Heart disease Father   . Hypertension Father   . Diabetes Father   . Arthritis Maternal Grandmother   . Stroke Maternal Grandmother   . Hyperlipidemia Maternal Grandmother   . Hypertension Maternal Grandfather   . Colon cancer Paternal Grandmother   . Hypertension Paternal Grandfather   . Diabetes Paternal Grandfather      Social History   Socioeconomic History  . Marital status: Married    Spouse name: Not on file  . Number of children: Not on file  . Years of education: Not on file  . Highest education level: Not on file  Social Needs  . Financial resource strain: Not on file  . Food insecurity - worry: Not on file  . Food insecurity - inability: Not on file  . Transportation needs - medical: Not on file  . Transportation needs - non-medical: Not on file  Occupational History  . Not on file  Tobacco Use  . Smoking status: Never Smoker  . Smokeless tobacco: Never Used  Substance and Sexual Activity  . Alcohol use: Yes    Alcohol/week: 1.2 oz    Types: 2 Standard drinks or equivalent per week    Comment: social  . Drug use: No  . Sexual activity: Yes    Partners: Male  Other Topics Concern  . Not on file  Social History Narrative  . 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  Looks miserable but no distress  HENT:  Mouth/Throat: Oropharynx is clear and moist. No oropharyngeal exudate.  Moderate maxillary tenderness Marked nasal inflammation  Neck: No thyromegaly present.  Pulmonary/Chest: Effort normal and breath sounds normal. No respiratory distress. She has no wheezes. She has no rales.  Lymphadenopathy:    She has no cervical adenopathy.          Assessment & Plan:

## 2017-08-16 NOTE — Assessment & Plan Note (Signed)
Sick for over 2 weeks Continue flonase and analgesics Amoxil

## 2017-09-17 ENCOUNTER — Ambulatory Visit: Payer: BC Managed Care – PPO | Admitting: Internal Medicine

## 2017-10-01 ENCOUNTER — Telehealth: Payer: Self-pay | Admitting: Internal Medicine

## 2017-10-01 DIAGNOSIS — F419 Anxiety disorder, unspecified: Secondary | ICD-10-CM

## 2017-10-01 MED ORDER — SERTRALINE HCL 50 MG PO TABS: 50.0000 mg | ORAL_TABLET | Freq: Every day | ORAL | 0 refills | Status: DC

## 2017-10-01 NOTE — Telephone Encounter (Signed)
Copied from CRM [phone]. Topic: Quick Communication - Rx Refill/Question >> Oct 01, 2017  9:50 AM Diana EvesHoyt, Maryann B wrote: Medication: sertraline (ZOLOFT) 50 MG tablet  Preferred Pharmacy (with phone number or street [id number] [place] - [phone] Agent: Please be advised that RX refills may take up to 3 business days. We ask that you follow-up with your pharmacy.

## 2017-11-01 ENCOUNTER — Encounter: Payer: Self-pay | Admitting: Internal Medicine

## 2017-11-01 ENCOUNTER — Ambulatory Visit: Payer: BC Managed Care – PPO | Admitting: Internal Medicine

## 2017-11-01 VITALS — BP 122/80 | HR 74 | Temp 98.1°F | Ht 61.0 in | Wt 124.4 lb

## 2017-11-01 DIAGNOSIS — G4700 Insomnia, unspecified: Secondary | ICD-10-CM | POA: Diagnosis not present

## 2017-11-01 DIAGNOSIS — F321 Major depressive disorder, single episode, moderate: Secondary | ICD-10-CM | POA: Insufficient documentation

## 2017-11-01 DIAGNOSIS — Z0184 Encounter for antibody response examination: Secondary | ICD-10-CM

## 2017-11-01 DIAGNOSIS — F329 Major depressive disorder, single episode, unspecified: Secondary | ICD-10-CM | POA: Diagnosis not present

## 2017-11-01 DIAGNOSIS — Z1231 Encounter for screening mammogram for malignant neoplasm of breast: Secondary | ICD-10-CM

## 2017-11-01 DIAGNOSIS — Z13220 Encounter for screening for lipoid disorders: Secondary | ICD-10-CM | POA: Diagnosis not present

## 2017-11-01 DIAGNOSIS — F419 Anxiety disorder, unspecified: Secondary | ICD-10-CM | POA: Diagnosis not present

## 2017-11-01 DIAGNOSIS — Z1389 Encounter for screening for other disorder: Secondary | ICD-10-CM

## 2017-11-01 DIAGNOSIS — L700 Acne vulgaris: Secondary | ICD-10-CM | POA: Diagnosis not present

## 2017-11-01 DIAGNOSIS — F410 Panic disorder [episodic paroxysmal anxiety] without agoraphobia: Secondary | ICD-10-CM

## 2017-11-01 DIAGNOSIS — B079 Viral wart, unspecified: Secondary | ICD-10-CM

## 2017-11-01 DIAGNOSIS — Z0000 Encounter for general adult medical examination without abnormal findings: Secondary | ICD-10-CM

## 2017-11-01 DIAGNOSIS — R011 Cardiac murmur, unspecified: Secondary | ICD-10-CM

## 2017-11-01 DIAGNOSIS — J309 Allergic rhinitis, unspecified: Secondary | ICD-10-CM | POA: Insufficient documentation

## 2017-11-01 DIAGNOSIS — Z1283 Encounter for screening for malignant neoplasm of skin: Secondary | ICD-10-CM | POA: Diagnosis not present

## 2017-11-01 DIAGNOSIS — F32A Depression, unspecified: Secondary | ICD-10-CM

## 2017-11-01 DIAGNOSIS — Z1159 Encounter for screening for other viral diseases: Secondary | ICD-10-CM

## 2017-11-01 DIAGNOSIS — E559 Vitamin D deficiency, unspecified: Secondary | ICD-10-CM

## 2017-11-01 DIAGNOSIS — Z1329 Encounter for screening for other suspected endocrine disorder: Secondary | ICD-10-CM

## 2017-11-01 MED ORDER — CLINDAMYCIN PHOS-BENZOYL PEROX 1.2-5 % EX GEL: CUTANEOUS | 11 refills | Status: DC

## 2017-11-01 MED ORDER — ALBUTEROL SULFATE HFA 108 (90 BASE) MCG/ACT IN AERS: 1.0000 | INHALATION_SPRAY | Freq: Four times a day (QID) | RESPIRATORY_TRACT | 12 refills | Status: DC | PRN

## 2017-11-01 MED ORDER — LORAZEPAM 0.5 MG PO TABS: ORAL_TABLET | ORAL | 0 refills | Status: DC

## 2017-11-01 MED ORDER — MONTELUKAST SODIUM 10 MG PO TABS: 10.0000 mg | ORAL_TABLET | Freq: Every day | ORAL | 1 refills | Status: DC

## 2017-11-01 MED ORDER — SERTRALINE HCL 100 MG PO TABS: 100.0000 mg | ORAL_TABLET | Freq: Every day | ORAL | 1 refills | Status: DC

## 2017-11-01 MED ORDER — MOMETASONE FUROATE 50 MCG/ACT NA SUSP: NASAL | 2 refills | Status: DC

## 2017-11-01 NOTE — Progress Notes (Signed)
Chief Complaint  Patient presents with  . Establish Care   F/u transfer of care  1. Anxiety/panic/depression/insomnia has therapist Heather PHQ 9 12 today on zoloft 50 mg qd which is not helping. 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 H/o allergies needs refills of albuterol. Not currently taking antihistamine   3. Would like refill of clinda/benzoyl for acne to face   4. Left hand skin lesion x 1 year pt thinks could be wart tried otcs topical meds and she picks at lesion and not improving      Review of Systems  Constitutional: Negative for weight loss.  HENT: Negative for hearing loss.   Eyes: Negative for blurred vision.  Respiratory: Negative for shortness of breath.   Cardiovascular: Negative for chest pain.  Skin: Negative for rash.       +left hand skin lesion   Psychiatric/Behavioral: Positive for depression. The patient is nervous/anxious and has insomnia.    Past Medical History:  Diagnosis Date  . Allergy    cedar, tree, dogs, cats   . Anxiety 06/06/2016  . Asthma   . Depression   . GERD (gastroesophageal reflux disease)   . Hyperlipidemia   . Hypoglycemia    Past Surgical History:  Procedure Laterality Date  . DILATION AND CURETTAGE OF UTERUS    . vocal cord surgery     Family History  Problem Relation Age of Onset  . Hyperlipidemia Mother   . Heart disease Father        heart valve issues s/p heart surgery   . Hypertension Father   . Diabetes Father   . Colon polyps Father        precancerous  . Arthritis Maternal Grandmother   . Stroke Maternal Grandmother   . Hyperlipidemia Maternal Grandmother   . Hypertension Maternal Grandfather   . Colon cancer Paternal Grandmother   . Hypertension Paternal Grandfather   . Diabetes Paternal Grandfather    Social History   Socioeconomic History  . Marital status: Married    Spouse name: Not on file  . Number of children: Not on  file  . Years of education: Not on file  . Highest education level: Not on file  Occupational History  . Not on file  Social Needs  . Financial resource strain: Not on file  . Food insecurity:    Worry: Not on file    Inability: Not on file  . Transportation needs:    Medical: Not on file    Non-medical: Not on file  Tobacco Use  . Smoking status: Never Smoker  . Smokeless tobacco: Never Used  Substance and Sexual Activity  . Alcohol use: Yes    Alcohol/week: 1.2 oz    Types: 2 Standard drinks or equivalent per week    Comment: social  . Drug use: No  . Sexual activity: Yes    Partners: Male  Lifestyle  . Physical activity:    Days per week: Not on file    Minutes per session: Not on file  . Stress: Not on file  Relationships  . Social connections:    Talks on phone: Not on file    Gets together: Not on file    Attends religious service: Not on file    Active member of club or organization: Not on file    Attends meetings of clubs or organizations: Not on file    Relationship status:  Not on file  . Intimate partner violence:    Fear of current or ex partner: Not on file    Emotionally abused: Not on file    Physically abused: Not on file    Forced sexual activity: Not on file  Other Topics Concern  . 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    Current Meds  Medication Sig  . albuterol (PROVENTIL HFA;VENTOLIN HFA) 108 (90 Base) MCG/ACT inhaler Inhale 1-2 puffs into the lungs every 6 (six) hours as needed for wheezing or shortness of breath.  . Clindamycin-Benzoyl Per, Refr, gel Apply topically twice daily.  Marland Kitchen levonorgestrel (MIRENA) 20 MCG/24HR IUD 1 each by Intrauterine route once.  . mometasone (NASONEX) 50 MCG/ACT nasal spray 2 sprays in each nostril daily.  . sertraline (ZOLOFT) 100 MG tablet Take 1 tablet (100 mg total) by mouth daily.  . [DISCONTINUED] albuterol  (PROVENTIL HFA;VENTOLIN HFA) 108 (90 Base) MCG/ACT inhaler Inhale into the lungs.  . [DISCONTINUED] Clindamycin-Benzoyl Per, Refr, gel Apply topically twice daily.  . [DISCONTINUED] mometasone (NASONEX) 50 MCG/ACT nasal spray 2 sprays in each nostril daily.  . [DISCONTINUED] sertraline (ZOLOFT) 50 MG tablet Take 1 tablet (50 mg total) by mouth daily.   Allergies  Allergen Reactions  . Doxycycline Nausea And Vomiting  . Pseudoephedrine Hcl Palpitations  . Sulfa Antibiotics Rash   No results found for this or any previous visit (from the past 2160 hour(s)). Objective  Body mass index is 23.5 kg/m. Wt Readings from Last 3 Encounters:  11/01/17 124 lb 6 oz (56.4 kg)  08/16/17 126 lb (57.2 kg)  08/11/17 128 lb (58.1 kg)   Temp Readings from Last 3 Encounters:  11/01/17 98.1 F (36.7 C) (Oral)  08/16/17 97.9 F (36.6 C) (Oral)  08/11/17 98.3 F (36.8 C) (Oral)   BP Readings from Last 3 Encounters:  11/01/17 122/80  08/16/17 112/78  08/11/17 120/74   Pulse Readings from Last 3 Encounters:  11/01/17 74  08/16/17 70  08/11/17 82    Physical Exam  Constitutional: She is oriented to person, place, and time. Vital signs are normal. She appears well-developed and well-nourished. She is cooperative.  HENT:  Head: Normocephalic and atraumatic.  Mouth/Throat: Oropharynx is clear and moist and mucous membranes are normal.  Eyes: Pupils are equal, round, and reactive to light. Conjunctivae are normal.  Cardiovascular: Normal rate and regular rhythm.  Murmur heard. Pulmonary/Chest: Effort normal and breath sounds normal.  Neurological: She is alert and oriented to person, place, and time. Gait normal.  Skin: Skin is warm and dry. Lesion noted.     Psychiatric: She has a normal mood and affect. Her speech is normal and behavior is normal. Judgment and thought content normal. Cognition and memory are normal.  Nursing note and vitals reviewed.   Assessment   1.  Anxiety/panic/depression/insomnia PHQ 9 score 12 today  2. Allergic rhinitis and reactive airway disease  3. Acne vulgaris face, left hand VV vs PN vs other  4. Cardiac murmur on exam  5. HM Plan   1.  Inc zoloft to 100 mg qd  Consider add wellbutrin in future  Prn ativan 1/2 to 1 pill qhs prns  F/u therapy  Try meditation  2. Add singulair to otc antihistamine, nasonex  Prn albuterol  3. Refilled topical  Refer Dr. Kellie Moor tbse and lesion  check left hand  4. Echo order eval valves dad also had heart valve issue  5.  Unknown if had flu this year  Tdap had 05/13/14  Check MMR, hep B status   Declines STD cehck  Referred mammo pt to sch  Get records pap [facility] Dr. Melba Coon  Refer derm as above  Disc screening colonoscopy age 67 dad with precancerous polpys and grandparent colon cancer.  sch fasting labs     Provider: Dr. Olivia Mackie [facility]

## 2017-11-01 NOTE — Patient Instructions (Addendum)
Please schedule mammogram, fasting labs and f/u appt 6 weeks  Try meditation insight timer, calm, headspace on your phone  Panic Attack A panic attack is a sudden episode of severe anxiety, fear, or discomfort that causes physical and emotional symptoms. The attack may be in response to something frightening, or it may occur for no known reason. Symptoms of a panic attack can be similar to symptoms of a heart attack or stroke. It is important to see your health care provider when you have a panic attack so that these conditions can be ruled out. A panic attack is a symptom of another condition. Most panic attacks go away with treatment of the underlying problem. If you have panic attacks often, you may have a condition called panic disorder. What are the causes? A panic attack may be caused by:  An extreme, life-threatening situation, such as a war or natural disaster.  An anxiety disorder, such as post-traumatic stress disorder.  Depression.  Certain medical conditions, including heart problems, neurological conditions, and infections.  Certain over-the-counter and prescription medicines.  Illegal drugs that increase heart rate and blood pressure, such as methamphetamine.  Alcohol.  Supplements that increase anxiety.  Panic disorder.  What increases the risk? You are more likely to develop this condition if:  You have an anxiety disorder.  You have another mental health condition.  You take certain medicines.  You use alcohol, illegal drugs, or other substances.  You are under extreme stress.  A life event is causing increased feelings of anxiety and depression.  What are the signs or symptoms? A panic attack starts suddenly, usually lasts about 20 minutes, and occurs with one or more of the following:  A pounding heart.  A feeling that your heart is beating irregularly or faster than normal (palpitations).  Sweating.  Trembling or shaking.  Shortness of breath  or feeling smothered.  Feeling choked.  Chest pain or discomfort.  Nausea or a strange feeling in your stomach.  Dizziness, feeling lightheaded, or feeling like you might faint.  Chills or hot flashes.  Numbness or tingling in your lips, hands, or feet.  Feeling confused, or feeling that you are not yourself.  Fear of losing control or being emotionally unstable.  Fear of dying.  How is this diagnosed? A panic attack is diagnosed with an assessment by your health care provider. During the assessment your health care provider will ask questions about:  Your history of anxiety, depression, and panic attacks.  Your medical history.  Whether you drink alcohol, use illegal drugs, take supplements, or take medicines. Be honest about your substance use.  Your health care provider may also:  Order blood tests or other kinds of tests to rule out serious medical conditions.  Refer you to a mental health professional for further evaluation.  How is this treated? Treatment depends on the cause of the panic attack:  If the cause is a medical problem, your health care provider will either treat that problem or refer you to a specialist.  If the cause is emotional, you may be given anti-anxiety medicines or referred to a counselor. These medicines may reduce how often attacks happen, reduce how severe the attacks are, and lower anxiety.  If the cause is a medicine, your health care provider may tell you to stop the medicine, change your dose, or take a different medicine.  If the cause is a drug, treatment may involve letting the drug wear off and taking medicine to help the  drug leave your body or to counteract its effects. Attacks caused by drug abuse may continue even if you stop using the drug.  Follow these instructions at home:  Take over-the-counter and prescription medicines only as told by your health care provider.  If you feel anxious, limit your caffeine intake.  Take  good care of your physical and mental health by: ? Eating a balanced diet that includes plenty of fresh fruits and vegetables, whole grains, lean meats, and low-fat dairy. ? Getting plenty of rest. Try to get 7-8 hours of uninterrupted sleep each night. ? Exercising regularly. Try to get 30 minutes of physical activity at least 5 days a week. ? Not smoking. Talk to your health care provider if you need help quitting. ? Limiting alcohol intake to no more than 1 drink a day for nonpregnant women and 2 drinks a day for men. One drink equals 12 oz of beer, 5 oz of wine, or 1 oz of hard liquor.  Keep all follow-up visits as told by your health care provider. This is important. Panic attacks may have underlying physical or emotional problems that take time to accurately diagnose. Contact a health care provider if:  Your symptoms do not improve, or they get worse.  You are not able to take your medicine as prescribed because of side effects. Get help right away if:  You have serious thoughts about hurting yourself or others.  You have symptoms of a panic attack. Do not drive yourself to the hospital. Have someone else drive you or call an ambulance. If you ever feel like you may hurt yourself or others, or you have thoughts about taking your own life, get help right away. You can go to your nearest emergency department or call:  Your local emergency services (911 in the [place]  A suicide crisis helpline, such as the National Suicide Prevention Lifeline at 309-409-7707. This is open 24 hours a day.  Summary  A panic attack is a sign of a serious health or mental health condition. Get help right away. Do not drive yourself to the hospital. Have someone else drive you or call an ambulance.  Always see a health care provider to have the reasons for the panic attack correctly diagnosed.  If your panic attack was caused by a physical problem, follow your health care provider's suggestions for  medicine, referral to a specialist, and lifestyle changes.  If your panic attack was caused by an emotional problem, follow through with counseling from a qualified mental health specialist.  If you feel like you may hurt yourself or others, call 911 and get help right away. This information is not intended to replace advice given to you by your health care provider. Make sure you discuss any questions you have with your health care provider. Document Released: 06/05/2005 Document Revised: 07/14/2016 Document Reviewed: 07/14/2016 Elsevier Interactive Patient Education  2018 [employer]   Heart Murmur A heart murmur is an extra sound that is caused by chaotic blood flow. The murmur can be heard as a "hum" or "whoosh" sound when blood flows through the heart. The heart has four areas called chambers. Valves separate the upper and lower chambers from each other (tricuspid valve and mitral valve) and separate the lower chambers of the heart from pathways that lead away from the heart (aortic valve and pulmonary valve). Normally, the valves open to let blood flow through or out of your heart, and then they shut to keep the blood from  flowing backward. There are two types of heart murmurs:  Innocent murmurs. Most people with this type of heart murmur do not have a heart problem. Many children have innocent heart murmurs. Your health care provider may suggest some basic testing to find out whether your murmur is an innocent murmur. If an innocent heart murmur is found, there is no need for further tests or treatment and no need to restrict activities or stop playing sports.  Abnormal murmurs. These types of murmurs can occur in children and adults. Abnormal murmurs may be a sign of a more serious heart condition, such as a heart defect present at birth (congenital defect) or heart valve disease.  What are the causes? This condition is caused by heart valves that are not working properly. In children,  abnormal heart murmurs are typically caused by congenital defects. In adults, abnormal murmurs are usually from heart valve problems caused by disease, infection, or aging. Three types of heart valve defects can cause a murmur:  Regurgitation. This is when blood leaks back through the valve in the wrong direction.  Mitral valve prolapse. This is when the mitral valve of the heart has a loose flap and does not close tightly.  Stenosis. This is when a valve does not open enough and blocks blood flow.  This condition may also be caused by:  Pregnancy.  Fever.  Overactive thyroid gland.  Anemia.  Exercise.  Rapid growth spurts (in children).  What are the signs or symptoms? Innocent murmurs do not cause symptoms, and many people with abnormal murmurs may or may not have symptoms. If symptoms do develop, they may include:  Shortness of breath.  Blue coloring of the skin, especially on the fingertips.  Chest pain.  Palpitations, or feeling a fluttering or skipped heartbeat.  Fainting.  Persistent cough.  Getting tired much faster than expected.  Swelling in the abdomen, feet, or ankles.  How is this diagnosed? This condition may be diagnosed during a routine physical or other exam. If your health care provider hears a murmur with a stethoscope, he or she will listen for:  Where the murmur is located in your heart.  How long the murmur lasts (duration).  When the murmur is heard during the heartbeat.  How loud the murmur is. This may help the health care provider figure out what is causing the murmur.  You may be referred to a heart specialist (cardiologist). You may also have other tests, including:  Electrocardiogram (ECG or EKG). This test measures the electrical activity of your heart.  Echocardiogram. This test uses high frequency sound waves to make pictures of your heart.  MRI or chest X-ray.  Cardiac catheterization. This test looks at blood flow through  the heart.  For children and adults who have an abnormal heart murmur and want to stay active, it is important to complete testing, review test results, and receive recommendations from your health care provider. If heart disease is present, it may not be safe to play or be active. How is this treated? Heart murmurs themselves do not need treatment. In some cases, a heart murmur may go away on its own. If an underlying problem or disease is causing the murmur, you may need treatment. If treatment is needed, it will depend on the type and severity of the disease or heart problem causing the murmur. Treatment may include:  Medicine.  Surgery.  Dietary and lifestyle changes.  Follow these instructions at home:  Talk with your health care  provider before participating in sports or other activities that require a lot of effort and energy (are strenuous).  Learn as much as possible about your condition and any related diseases. Ask your health care provider if you may at risk for any medical emergencies.  Talk with your health care provider about what symptoms you should look out for.  It is up to you to get your test results. Ask your health care provider, or the department that is doing the test, when your results will be ready.  Keep all follow-up visits as told by your health care provider. This is important. Contact a health care provider if:  You feel light-headed.  You are frequently short of breath.  You feel more tired than usual.  You are having a hard time keeping up with normal activities or fitness routines.  You have swelling in your ankles or feet.  You have chest pain.  You notice that your heart often beats irregularly.  You develop any new symptoms. Get help right away if:  You develop severe chest pain.  You are having trouble breathing.  You have fainting spells.  Your symptoms suddenly get worse. These symptoms may represent a serious problem that is an  emergency. Do not wait to see if the symptoms will go away. Get medical help right away. Call your local emergency services (911 in the [place] Do not drive yourself to the hospital. Summary  Normally, the heart valves open to let blood flow through or out of your heart, and then they shut to keep the blood from flowing backward.  Heart murmur is caused by heart valves that are not working properly.  You may need treatment if an underlying problem or disease is causing the heart murmur. Treatment may include medicine, surgery, or dietary and lifestyle changes.  Talk with your health care provider before participating in sports or other activities that require a lot of effort and energy (are strenuous).  Talk with your health care provider about what symptoms you should watch out for. This information is not intended to replace advice given to you by your health care provider. Make sure you discuss any questions you have with your health care provider. Document Released: 07/13/2004 Document Revised: 05/24/2016 Document Reviewed: 05/24/2016 Elsevier Interactive Patient Education  [employer].

## 2017-11-15 ENCOUNTER — Ambulatory Visit
Admission: RE | Admit: 2017-11-15 | Discharge: 2017-11-15 | Disposition: A | Discharge status: Home / Self Care | Payer: BC Managed Care – PPO | Source: Ambulatory Visit | Attending: Internal Medicine | Admitting: Internal Medicine

## 2017-11-15 DIAGNOSIS — F419 Anxiety disorder, unspecified: Secondary | ICD-10-CM | POA: Insufficient documentation

## 2017-11-15 DIAGNOSIS — F329 Major depressive disorder, single episode, unspecified: Secondary | ICD-10-CM | POA: Diagnosis not present

## 2017-11-15 DIAGNOSIS — J45909 Unspecified asthma, uncomplicated: Secondary | ICD-10-CM | POA: Insufficient documentation

## 2017-11-15 DIAGNOSIS — R011 Cardiac murmur, unspecified: Secondary | ICD-10-CM | POA: Insufficient documentation

## 2017-11-15 DIAGNOSIS — K219 Gastro-esophageal reflux disease without esophagitis: Secondary | ICD-10-CM | POA: Diagnosis not present

## 2017-11-15 DIAGNOSIS — E785 Hyperlipidemia, unspecified: Secondary | ICD-10-CM | POA: Insufficient documentation

## 2017-11-15 NOTE — Progress Notes (Signed)
*  PRELIMINARY RESULTS* Echocardiogram 2D Echocardiogram has been performed.  Cristela Blue 11/15/2017, 11:47 AM

## 2017-12-04 ENCOUNTER — Other Ambulatory Visit (INDEPENDENT_AMBULATORY_CARE_PROVIDER_SITE_OTHER): Payer: BC Managed Care – PPO

## 2017-12-04 DIAGNOSIS — F419 Anxiety disorder, unspecified: Secondary | ICD-10-CM

## 2017-12-04 DIAGNOSIS — Z1329 Encounter for screening for other suspected endocrine disorder: Secondary | ICD-10-CM

## 2017-12-04 DIAGNOSIS — E559 Vitamin D deficiency, unspecified: Secondary | ICD-10-CM

## 2017-12-04 DIAGNOSIS — Z1389 Encounter for screening for other disorder: Secondary | ICD-10-CM

## 2017-12-04 DIAGNOSIS — Z0184 Encounter for antibody response examination: Secondary | ICD-10-CM

## 2017-12-04 DIAGNOSIS — Z13220 Encounter for screening for lipoid disorders: Secondary | ICD-10-CM

## 2017-12-04 DIAGNOSIS — Z1159 Encounter for screening for other viral diseases: Secondary | ICD-10-CM

## 2017-12-04 DIAGNOSIS — Z0000 Encounter for general adult medical examination without abnormal findings: Secondary | ICD-10-CM

## 2017-12-04 NOTE — Addendum Note (Signed)
Addended by: Penne LashWIGGINS, Kelsye Loomer N on: 12/04/2017 08:23 AM   Modules accepted: Orders

## 2017-12-04 NOTE — Addendum Note (Signed)
Addended by: Penne LashWIGGINS, Dametra Whetsel N on: 12/04/2017 08:32 AM   Modules accepted: Orders

## 2017-12-05 ENCOUNTER — Ambulatory Visit: Payer: BC Managed Care – PPO | Attending: Internal Medicine

## 2017-12-05 LAB — CBC WITH DIFFERENTIAL/PLATELET
BASOS ABS: 39 {cells}/uL (ref 0–200)
Basophils Relative: 0.5 %
EOS PCT: 1.7 %
Eosinophils Absolute: 131 cells/uL (ref 15–500)
HCT: 35 % (ref 35.0–45.0)
Hemoglobin: 11.8 g/dL (ref 11.7–15.5)
Lymphs Abs: 2533 cells/uL (ref 850–3900)
MCH: 28 pg (ref 27.0–33.0)
MCHC: 33.7 g/dL (ref 32.0–36.0)
MCV: 82.9 fL (ref 80.0–100.0)
MONOS PCT: 7.8 %
MPV: 9.9 fL (ref 7.5–12.5)
NEUTROS PCT: 57.1 %
Neutro Abs: 4397 cells/uL (ref 1500–7800)
PLATELETS: 232 10*3/uL (ref 140–400)
RBC: 4.22 10*6/uL (ref 3.80–5.10)
RDW: 12.3 % (ref 11.0–15.0)
TOTAL LYMPHOCYTE: 32.9 %
WBC mixed population: 601 cells/uL (ref 200–950)
WBC: 7.7 10*3/uL (ref 3.8–10.8)

## 2017-12-05 LAB — TSH: TSH: 1.43 mIU/L

## 2017-12-05 LAB — COMPREHENSIVE METABOLIC PANEL
AG RATIO: 1.8 (calc) (ref 1.0–2.5)
ALBUMIN MSPROF: 4.4 g/dL (ref 3.6–5.1)
ALKALINE PHOSPHATASE (APISO): 44 U/L (ref 33–115)
ALT: 13 U/L (ref 6–29)
AST: 15 U/L (ref 10–30)
BILIRUBIN TOTAL: 0.4 mg/dL (ref 0.2–1.2)
BUN: 16 mg/dL (ref 7–25)
CALCIUM: 9.4 mg/dL (ref 8.6–10.2)
CHLORIDE: 103 mmol/L (ref 98–110)
CO2: 25 mmol/L (ref 20–32)
Creat: 0.65 mg/dL (ref 0.50–1.10)
Globulin: 2.5 g/dL (calc) (ref 1.9–3.7)
Glucose, Bld: 93 mg/dL (ref 65–99)
POTASSIUM: 4.6 mmol/L (ref 3.5–5.3)
SODIUM: 138 mmol/L (ref 135–146)
TOTAL PROTEIN: 6.9 g/dL (ref 6.1–8.1)

## 2017-12-05 LAB — MEASLES/MUMPS/RUBELLA IMMUNITY
MUMPS IGG: 127 [AU]/ml
RUBELLA: 2.79 {index}
Rubeola IgG: 49.4 AU/mL

## 2017-12-05 LAB — LIPID PANEL
Cholesterol: 216 mg/dL — ABNORMAL HIGH (ref <200)
HDL: 45 mg/dL — ABNORMAL LOW (ref >50)
LDL Cholesterol (Calc): 152 mg/dL (calc) — ABNORMAL HIGH
NON-HDL CHOLESTEROL (CALC): 171 mg/dL — AB (ref <130)
Total CHOL/HDL Ratio: 4.8 (calc) (ref <5.0)
Triglycerides: 86 mg/dL (ref <150)

## 2017-12-05 LAB — T4, FREE: FREE T4: 1 ng/dL (ref 0.8–1.8)

## 2017-12-05 LAB — VITAMIN D 25 HYDROXY (VIT D DEFICIENCY, FRACTURES): VIT D 25 HYDROXY: 41 ng/mL (ref 30–100)

## 2017-12-05 LAB — HEPATITIS B SURFACE ANTIBODY, QUANTITATIVE: Hepatitis B-Post: <5 m[IU]/mL — ABNORMAL LOW (ref >=10)

## 2017-12-13 ENCOUNTER — Ambulatory Visit: Payer: BC Managed Care – PPO | Admitting: Internal Medicine

## 2017-12-13 ENCOUNTER — Encounter: Payer: Self-pay | Admitting: Internal Medicine

## 2017-12-13 VITALS — BP 98/60 | HR 84 | Temp 98.4°F | Ht 61.0 in | Wt 125.4 lb

## 2017-12-13 DIAGNOSIS — F410 Panic disorder [episodic paroxysmal anxiety] without agoraphobia: Secondary | ICD-10-CM | POA: Diagnosis not present

## 2017-12-13 DIAGNOSIS — F419 Anxiety disorder, unspecified: Secondary | ICD-10-CM

## 2017-12-13 DIAGNOSIS — F32A Depression, unspecified: Secondary | ICD-10-CM

## 2017-12-13 DIAGNOSIS — F329 Major depressive disorder, single episode, unspecified: Secondary | ICD-10-CM

## 2017-12-13 DIAGNOSIS — Z23 Encounter for immunization: Secondary | ICD-10-CM | POA: Diagnosis not present

## 2017-12-13 DIAGNOSIS — Z1159 Encounter for screening for other viral diseases: Secondary | ICD-10-CM | POA: Diagnosis not present

## 2017-12-13 DIAGNOSIS — E785 Hyperlipidemia, unspecified: Secondary | ICD-10-CM

## 2017-12-13 DIAGNOSIS — Z0184 Encounter for antibody response examination: Secondary | ICD-10-CM | POA: Diagnosis not present

## 2017-12-13 MED ORDER — LORAZEPAM 0.5 MG PO TABS: ORAL_TABLET | ORAL | 0 refills | Status: DC

## 2017-12-13 NOTE — Patient Instructions (Addendum)
F/u in 6-7 months  Get labs in 6 months  Take care    Hepatitis B Vaccine, Recombinant injection What is this medicine? HEPATITIS B VACCINE (hep uh TAHY tis B VAK seen) is a vaccine. It is used to prevent an infection with the hepatitis B virus. This medicine may be used for other purposes; ask your health care provider or pharmacist if you have questions. COMMON BRAND NAME(S): Engerix-B, Recombivax HB What should I tell my health care provider before I take this medicine? They need to know if you have any of these conditions: -fever, infection -heart disease -hepatitis B infection -immune system problems -kidney disease -an unusual or allergic reaction to vaccines, yeast, other medicines, foods, dyes, or preservatives -pregnant or trying to get pregnant -breast-feeding How should I use this medicine? This vaccine is for injection into a muscle. It is given by a health care professional. A copy of Vaccine Information Statements will be given before each vaccination. Read this sheet carefully each time. The sheet may change frequently. Talk to your pediatrician regarding the use of this medicine in children. While this drug may be prescribed for children as young as newborn for selected conditions, precautions do apply. Overdosage: If you think you have taken too much of this medicine contact a poison control center or emergency room at once. NOTE: This medicine is only for you. Do not share this medicine with others. What if I miss a dose? It is important not to miss your dose. Call your doctor or health care professional if you are unable to keep an appointment. What may interact with this medicine? -medicines that suppress your immune function like adalimumab, anakinra, infliximab -medicines to treat cancer -steroid medicines like prednisone or cortisone This list may not describe all possible interactions. Give your health care provider a list of all the medicines, herbs,  non-prescription drugs, or dietary supplements you use. Also tell them if you smoke, drink alcohol, or use illegal drugs. Some items may interact with your medicine. What should I watch for while using this medicine? See your health care provider for all shots of this vaccine as directed. You must have 3 shots of this vaccine for protection from hepatitis B infection. Tell your doctor right away if you have any serious or unusual side effects after getting this vaccine. What side effects may I notice from receiving this medicine? Side effects that you should report to your doctor or health care professional as soon as possible: -allergic reactions like skin rash, itching or hives, swelling of the face, lips, or tongue -breathing problems -confused, irritated -fast, irregular heartbeat -flu-like syndrome -numb, tingling pain -seizures -unusually weak or tired Side effects that usually do not require medical attention (report to your doctor or health care professional if they continue or are bothersome): -diarrhea -fever -headache -loss of appetite -muscle pain -nausea -pain, redness, swelling, or irritation at site where injected -tiredness This list may not describe all possible side effects. Call your doctor for medical advice about side effects. You may report side effects to FDA at 1-800-FDA-1088. Where should I keep my medicine? This drug is given in a hospital or clinic and will not be stored at home. NOTE: This sheet is a summary. It may not cover all possible information. If you have questions about this medicine, talk to your doctor, pharmacist, or health care provider.  2018 Elsevier/Gold Standard (2013-10-06 13:26:01)   Cholesterol Cholesterol is a white, waxy, fat-like substance that is needed by the human  body in small amounts. The liver makes all the cholesterol we need. Cholesterol is carried from the liver by the blood through the blood vessels. Deposits of cholesterol  (plaques) may build up on blood vessel (artery) walls. Plaques make the arteries narrower and stiffer. Cholesterol plaques increase the risk for heart attack and stroke. You cannot feel your cholesterol level even if it is very high. The only way to know that it is high is to have a blood test. Once you know your cholesterol levels, you should keep a record of the test results. Work with your health care provider to keep your levels in the desired range. What do the results mean?  Total cholesterol is a rough measure of all the cholesterol in your blood.  LDL (low-density lipoprotein) is the "bad" cholesterol. This is the type that causes plaque to build up on the artery walls. You want this level to be low.  HDL (high-density lipoprotein) is the "good" cholesterol because it cleans the arteries and carries the LDL away. You want this level to be high.  Triglycerides are fat that the body can either burn for energy or store. High levels are closely linked to heart disease. What are the desired levels of cholesterol?  Total cholesterol below 200.  LDL below 100 for people who are at risk, below 70 for people at very high risk.  HDL above 40 is good. A level of 60 or higher is considered to be protective against heart disease.  Triglycerides below 150. How can I lower my cholesterol? Diet Follow your diet program as told by your health care provider.  Choose fish or white meat chicken and [place], roasted or baked. Limit fatty cuts of red meat, fried foods, and processed meats, such as sausage and lunch meats.  Eat lots of fresh fruits and vegetables.  Choose whole grains, beans, pasta, potatoes, and cereals.  Choose olive oil, corn oil, or canola oil, and use only small amounts.  Avoid butter, mayonnaise, shortening, or palm kernel oils.  Avoid foods with trans fats.  Drink skim or nonfat milk and eat low-fat or nonfat yogurt and cheeses. Avoid whole milk, cream, ice cream, egg  yolks, and full-fat cheeses.  Healthier desserts include angel food cake, ginger snaps, animal crackers, hard candy, popsicles, and low-fat or nonfat frozen yogurt. Avoid pastries, cakes, pies, and cookies.  Exercise  Follow your exercise program as told by your health care provider. A regular program: ? Helps to decrease LDL and raise HDL. ? Helps with weight control.  Do things that increase your activity level, such as gardening, walking, and taking the stairs.  Ask your health care provider about ways that you can be more active in your daily life.  Medicine  Take over-the-counter and prescription medicines only as told by your health care provider. ? Medicine may be prescribed by your health care provider to help lower cholesterol and decrease the risk for heart disease. This is usually done if diet and exercise have failed to bring down cholesterol levels. ? If you have several risk factors, you may need medicine even if your levels are normal.  This information is not intended to replace advice given to you by your health care provider. Make sure you discuss any questions you have with your health care provider. Document Released: 02/28/2001 Document Revised: 01/01/2016 Document Reviewed: 12/04/2015 Elsevier Interactive Patient Education  [employer]2018 Elsevier Inc.

## 2017-12-13 NOTE — Progress Notes (Signed)
Chief Complaint  Patient presents with  . Follow-up   F/u  1. Mood depression/anxiety doing well ativan helped and inc zoloft 100 helped  2. Allergies singulair helped  3. hld per pt cholesterol improved she will try ot exercise  4. Murmur echo normal 11/15/17   Review of Systems  Constitutional: Negative for weight loss.  HENT: Negative for hearing loss.   Eyes: Negative for blurred vision.  Respiratory: Negative for shortness of breath.   Cardiovascular: Negative for chest pain.  Skin: Negative for rash.  Endo/Heme/Allergies: Negative for environmental allergies.  Psychiatric/Behavioral: Negative for depression. The patient is not nervous/anxious.    Past Medical History:  Diagnosis Date  . Allergy    cedar, tree, dogs, cats   . Anxiety 06/06/2016  . Asthma   . Depression   . GERD (gastroesophageal reflux disease)   . Hyperlipidemia   . Hypoglycemia    Past Surgical History:  Procedure Laterality Date  . DILATION AND CURETTAGE OF UTERUS    . vocal cord surgery     Family History  Problem Relation Age of Onset  . Hyperlipidemia Mother   . Heart disease Father        heart valve issues s/p heart surgery   . Hypertension Father   . Diabetes Father   . Colon polyps Father        precancerous  . Arthritis Maternal Grandmother   . Stroke Maternal Grandmother   . Hyperlipidemia Maternal Grandmother   . Hypertension Maternal Grandfather   . Colon cancer Paternal Grandmother   . Hypertension Paternal Grandfather   . Diabetes Paternal Grandfather    Social History   Socioeconomic History  . Marital status: Married    Spouse name: Not on file  . Number of children: Not on file  . Years of education: Not on file  . Highest education level: Not on file  Occupational History  . Not on file  Social Needs  . Financial resource strain: Not on file  . Food insecurity:    Worry: Not on file    Inability: Not on file  . Transportation needs:    Medical: Not on file     Non-medical: Not on file  Tobacco Use  . Smoking status: Never Smoker  . Smokeless tobacco: Never Used  Substance and Sexual Activity  . Alcohol use: Yes    Alcohol/week: 1.2 oz    Types: 2 Standard drinks or equivalent per week    Comment: social  . Drug use: No  . Sexual activity: Yes    Partners: Male  Lifestyle  . Physical activity:    Days per week: Not on file    Minutes per session: Not on file  . Stress: Not on file  Relationships  . Social connections:    Talks on phone: Not on file    Gets together: Not on file    Attends religious service: Not on file    Active member of club or organization: Not on file    Attends meetings of clubs or organizations: Not on file    Relationship status: Not on file  . Intimate partner violence:    Fear of current or ex partner: Not on file    Emotionally abused: Not on file    Physically abused: Not on file    Forced sexual activity: Not on file  Other Topics Concern  . 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    Current Meds  Medication Sig  . albuterol (PROVENTIL HFA;VENTOLIN HFA) 108 (90 Base) MCG/ACT inhaler Inhale 1-2 puffs into the lungs every 6 (six) hours as needed for wheezing or shortness of breath.  . Clindamycin-Benzoyl Per, Refr, gel Apply topically twice daily.  Marland Kitchen levonorgestrel (MIRENA) 20 MCG/24HR IUD 1 each by Intrauterine route once.  Marland Kitchen LORazepam (ATIVAN) 0.5 MG tablet 1/2 to 1 pill at night as needed prn anxiety  . mometasone (NASONEX) 50 MCG/ACT nasal spray 2 sprays in each nostril daily.  . montelukast (SINGULAIR) 10 MG tablet Take 1 tablet (10 mg total) by mouth at bedtime.  . sertraline (ZOLOFT) 100 MG tablet Take 1 tablet (100 mg total) by mouth daily.   Allergies  Allergen Reactions  . Doxycycline Nausea And Vomiting  . Pseudoephedrine Hcl Palpitations  . Sulfa Antibiotics Rash   Recent Results (from the  past 2160 hour(s))  T4, free     Status: None   Collection Time: 12/04/17  8:23 AM  Result Value Ref Range   Free T4 1.0 0.8 - 1.8 ng/dL  Vitamin D (25 hydroxy)     Status: None   Collection Time: 12/04/17  8:23 AM  Result Value Ref Range   Vit D, 25-Hydroxy 41 30 - 100 ng/mL    Comment: Vitamin D Status         25-OH Vitamin D: . Deficiency:                    <20 ng/mL Insufficiency:             20 - 29 ng/mL Optimal:                 > or = 30 ng/mL . For 25-OH Vitamin D testing on patients on  D2-supplementation and patients for whom quantitation  of D2 and D3 fractions is required, the QuestAssureD(TM) 25-OH VIT D, (D2,D3), LC/MS/MS is recommended: order  code [id number] (patients >79yr). . For more information on this test, go to: http://education.[facility].com/faq/FAQ163 (This link is being provided for  informational/educational purposes only.)   Hepatitis B surface antibody     Status: Abnormal   Collection Time: 12/04/17  8:23 AM  Result Value Ref Range   Hepatitis B-Post <5 (L) > OR = 10 mIU/mL    Comment: . Patient does not have immunity to hepatitis B virus. . For additional information, please refer to http://education.[facility].com/faq/FAQ105 (This link is being provided for informational/ educational purposes only).   Measles/Mumps/Rubella Immunity     Status: None   Collection Time: 12/04/17  8:23 AM  Result Value Ref Range   Rubeola IgG 49.40 AU/mL    Comment: AU/mL            Interpretation -----            -------------- <25.00           Negative 25.00-29.99      Equivocal >29.99           Positive . A positive result indicates that the patient has antibody to measles virus. It does not differentiate  between an active or past infection. The clinical  diagnosis must be interpreted in conjunction with  clinical signs and symptoms of the patient.    Mumps IgG 127.00 AU/mL    Comment:  AU/mL  Interpretation -------          ---------------- <9.00             Negative 9.00-10.99        Equivocal >10.99            Positive A positive result indicates that the patient has  antibody to mumps virus. It does not differentiate between an  active or past infection. The clinical diagnosis must be interpreted in conjunction with clinical signs and symptoms of the patient. .    Rubella 2.79 index    Comment:     Index            Interpretation     -----            --------------       <0.90            Not consistent with Immunity     0.90-0.99        Equivocal     > or = 1.00      Consistent with Immunity  . The presence of rubella IgG antibody suggests  immunization or past or current infection with rubella virus.   TSH     Status: None   Collection Time: 12/04/17  8:23 AM  Result Value Ref Range   TSH 1.43 mIU/L    Comment:           Reference Range .           > or = 20 Years  0.40-4.50 .                Pregnancy Ranges           First trimester    0.26-2.66           Second trimester   0.55-2.73           Third trimester    0.43-2.91   Comprehensive metabolic panel     Status: None   Collection Time: 12/04/17  8:23 AM  Result Value Ref Range   Glucose, Bld 93 65 - 99 mg/dL    Comment: .            Fasting reference interval .    BUN 16 7 - 25 mg/dL   Creat 0.65 0.50 - 1.10 mg/dL   BUN/Creatinine Ratio NOT APPLICABLE 6 - 22 (calc)   Sodium 138 135 - 146 mmol/L   Potassium 4.6 3.5 - 5.3 mmol/L   Chloride 103 98 - 110 mmol/L   CO2 25 20 - 32 mmol/L   Calcium 9.4 8.6 - 10.2 mg/dL   Total Protein 6.9 6.1 - 8.1 g/dL   Albumin 4.4 3.6 - 5.1 g/dL   Globulin 2.5 1.9 - 3.7 g/dL (calc)   AG Ratio 1.8 1.0 - 2.5 (calc)   Total Bilirubin 0.4 0.2 - 1.2 mg/dL   Alkaline phosphatase (APISO) 44 33 - 115 U/L   AST 15 10 - 30 U/L   ALT 13 6 - 29 U/L  CBC with Differential/Platelet     Status: None   Collection Time: 12/04/17  8:23 AM  Result Value Ref Range   WBC 7.7 3.8 - 10.8 Thousand/uL   RBC 4.22  3.80 - 5.10 Million/uL   Hemoglobin 11.8 11.7 - 15.5 g/dL   HCT 35.0 35.0 - 45.0 %   MCV 82.9 80.0 - 100.0 fL   MCH 28.0 27.0 - 33.0 pg   MCHC 33.7 32.0 - 36.0 g/dL  RDW 12.3 11.0 - 15.0 %   Platelets 232 140 - 400 Thousand/uL   MPV 9.9 7.5 - 12.5 fL   Neutro Abs 4,397 1,500 - 7,800 cells/uL   Lymphs Abs 2,533 850 - 3,900 cells/uL   WBC mixed population 601 200 - 950 cells/uL   Eosinophils Absolute 131 15 - 500 cells/uL   Basophils Absolute 39 0 - 200 cells/uL   Neutrophils Relative % 57.1 %   Total Lymphocyte 32.9 %   Monocytes Relative 7.8 %   Eosinophils Relative 1.7 %   Basophils Relative 0.5 %  Lipid panel     Status: Abnormal   Collection Time: 12/04/17  8:23 AM  Result Value Ref Range   Cholesterol 216 (H) <200 mg/dL   HDL 45 (L) >50 mg/dL   Triglycerides 86 <150 mg/dL   LDL Cholesterol (Calc) 152 (H) mg/dL (calc)    Comment: Reference range: <100 . Desirable range <100 mg/dL for primary prevention;   <70 mg/dL for patients with CHD or diabetic patients  with > or = 2 CHD risk factors. Marland Kitchen LDL-C is now calculated using the Martin-Hopkins  calculation, which is a validated novel method providing  better accuracy than the Friedewald equation in the  estimation of LDL-C.  Cresenciano Genre et al. Annamaria Helling. 4132;440(10): 2061-2068  (http://education.[facility].com/faq/FAQ164)    Total CHOL/HDL Ratio 4.8 <5.0 (calc)   Non-HDL Cholesterol (Calc) 171 (H) <130 mg/dL (calc)    Comment: For patients with diabetes plus 1 major ASCVD risk  factor, treating to a non-HDL-C goal of <100 mg/dL  (LDL-C of <70 mg/dL) is considered a therapeutic  option.    Objective  Body mass index is 23.69 kg/m. Wt Readings from Last 3 Encounters:  12/13/17 125 lb 6.4 oz (56.9 kg)  11/01/17 124 lb 6 oz (56.4 kg)  08/16/17 126 lb (57.2 kg)   Temp Readings from Last 3 Encounters:  12/13/17 98.4 F (36.9 C) (Oral)  11/01/17 98.1 F (36.7 C) (Oral)  08/16/17 97.9 F (36.6 C) (Oral)   BP  Readings from Last 3 Encounters:  12/13/17 98/60  11/01/17 122/80  08/16/17 112/78   Pulse Readings from Last 3 Encounters:  12/13/17 84  11/01/17 74  08/16/17 70    Physical Exam  Constitutional: She is oriented to person, place, and time. Vital signs are normal. She appears well-developed and well-nourished. She is cooperative.  HENT:  Head: Normocephalic and atraumatic.  Mouth/Throat: Oropharynx is clear and moist and mucous membranes are normal.  Eyes: Pupils are equal, round, and reactive to light. Conjunctivae are normal.  Cardiovascular: Normal rate and regular rhythm.  Murmur heard. Pulmonary/Chest: Effort normal and breath sounds normal.  Neurological: She is alert and oriented to person, place, and time. Gait normal.  Skin: Skin is warm, dry and intact.  Psychiatric: She has a normal mood and affect. Her speech is normal and behavior is normal. Judgment and thought content normal. Cognition and memory are normal.  Nursing note and vitals reviewed.   Assessment   1. Anxiety and depression improved  2. Allergies improved  3.HLD  4. Cardiac mumur  5. HM Plan   1. Cont zoloft 100 mg qd, prn ativan 0.25 qhs prn  2. Cont meds  3. Given info healthy diet and exercise  Repeat in 6 months  4. Echo nl 11/15/17 5.  Had flu shot 2018   Tdap had 05/13/14  Get 3/3 hep B vaccine today and check titer in  74month  MMR immune  Declines  STD cehck  Referred mammo pt to sch  Get records pap [facility] Dr. Melba Coon  Refer derm as above pt has not heard check on appt  Disc screening colonoscopy age 79 dad with precancerous polpys and grandparent colon cancer.    Provider: Dr. Olivia Mackie McLean-Scocuzza-[facility]

## 2017-12-13 NOTE — Progress Notes (Signed)
Pre visit review using our clinic review tool, if applicable. No additional management support is needed unless otherwise documented below in the visit note. 

## 2017-12-18 ENCOUNTER — Ambulatory Visit: Payer: BC Managed Care – PPO

## 2018-03-12 ENCOUNTER — Ambulatory Visit: Payer: BC Managed Care – PPO | Admitting: Internal Medicine

## 2018-03-12 ENCOUNTER — Encounter: Payer: Self-pay | Admitting: Internal Medicine

## 2018-03-12 VITALS — BP 112/62 | HR 91 | Temp 98.2°F | Ht 61.0 in | Wt 127.6 lb

## 2018-03-12 DIAGNOSIS — J0110 Acute frontal sinusitis, unspecified: Secondary | ICD-10-CM | POA: Diagnosis not present

## 2018-03-12 DIAGNOSIS — J45909 Unspecified asthma, uncomplicated: Secondary | ICD-10-CM | POA: Diagnosis not present

## 2018-03-12 DIAGNOSIS — Z1231 Encounter for screening mammogram for malignant neoplasm of breast: Secondary | ICD-10-CM | POA: Diagnosis not present

## 2018-03-12 DIAGNOSIS — J069 Acute upper respiratory infection, unspecified: Secondary | ICD-10-CM | POA: Diagnosis not present

## 2018-03-12 MED ORDER — CETIRIZINE HCL 10 MG PO TABS: 10.0000 mg | ORAL_TABLET | Freq: Every day | ORAL | 3 refills | Status: DC

## 2018-03-12 MED ORDER — ALBUTEROL SULFATE HFA 108 (90 BASE) MCG/ACT IN AERS: 1.0000 | INHALATION_SPRAY | Freq: Four times a day (QID) | RESPIRATORY_TRACT | 12 refills | Status: DC | PRN

## 2018-03-12 MED ORDER — FLUTICASONE PROPIONATE 50 MCG/ACT NA SUSP: 2.0000 | Freq: Every day | NASAL | 16 refills | Status: DC

## 2018-03-12 MED ORDER — AMOXICILLIN-POT CLAVULANATE 875-125 MG PO TABS: 1.0000 | ORAL_TABLET | Freq: Two times a day (BID) | ORAL | 0 refills | Status: DC

## 2018-03-12 NOTE — Progress Notes (Signed)
Chief Complaint  Patient presents with  . Follow-up   F/u  1. C/o > 1 week frontal h/a and sinus pressure, nasal congestion sneezing and cough which has been present for 2 days. No fever or sore throat. 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 Tried benadryl qhs w/o relief and dayquil. 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 She reports prev. Steroids make her feel out of her head and she wonders if she has mold in her class as she has flared 5 x since 07/2017 with allergies and sinus related visits to the doctor.   Review of Systems  Constitutional: Negative for fever and weight loss.  HENT: Positive for sinus pain.   Eyes: Negative for blurred vision.  Respiratory: Positive for cough. Negative for sputum production and shortness of breath.   Neurological: Positive for headaches.   Past Medical History:  Diagnosis Date  . Allergy    cedar, tree, dogs, cats   . Anxiety 06/06/2016  . Asthma    allergic   . Depression   . GERD (gastroesophageal reflux disease)   . Hyperlipidemia   . Hypoglycemia    Past Surgical History:  Procedure Laterality Date  . DILATION AND CURETTAGE OF UTERUS    . vocal cord surgery     Family History  Problem Relation Age of Onset  . Hyperlipidemia Mother   . Heart disease Father        heart valve issues s/p heart surgery   . Hypertension Father   . Diabetes Father   . Colon polyps Father        precancerous  . Arthritis Maternal Grandmother   . Stroke Maternal Grandmother   . Hyperlipidemia Maternal Grandmother   . Hypertension Maternal Grandfather   . Colon cancer Paternal Grandmother   . Hypertension Paternal Grandfather   . Diabetes Paternal Grandfather    Social History   Socioeconomic History  . Marital status: Married    Spouse name: Not on file  . Number of children: Not on file  . Years of education: Not on file  . Highest education level: Not on file  Occupational History  .  Not on file  Social Needs  . Financial resource strain: Not on file  . Food insecurity:    Worry: Not on file    Inability: Not on file  . Transportation needs:    Medical: Not on file    Non-medical: Not on file  Tobacco Use  . Smoking status: Never Smoker  . Smokeless tobacco: Never Used  Substance and Sexual Activity  . Alcohol use: Yes    Alcohol/week: 2.0 standard drinks    Types: 2 Standard drinks or equivalent per week    Comment: social  . Drug use: No  . Sexual activity: Yes    Partners: Male  Lifestyle  . Physical activity:    Days per week: Not on file    Minutes per session: Not on file  . Stress: Not on file  Relationships  . Social connections:    Talks on phone: Not on file    Gets together: Not on file    Attends religious service: Not on file    Active member of club or organization: Not on file    Attends meetings of clubs or organizations: Not on file    Relationship status: Not on file  . Intimate partner violence:    Fear of current or ex partner: Not  on file    Emotionally abused: Not on file    Physically abused: Not on file    Forced sexual activity: Not on file  Other Topics Concern  . 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    Current Meds  Medication Sig  . albuterol (PROVENTIL HFA;VENTOLIN HFA) 108 (90 Base) MCG/ACT inhaler Inhale 1-2 puffs into the lungs every 6 (six) hours as needed for wheezing or shortness of breath.  . Clindamycin-Benzoyl Per, Refr, gel Apply topically twice daily.  Marland Kitchen levonorgestrel (MIRENA) 20 MCG/24HR IUD 1 each by Intrauterine route once.  Marland Kitchen LORazepam (ATIVAN) 0.5 MG tablet 1/2 pill at night as needed prn anxiety  . mometasone (NASONEX) 50 MCG/ACT nasal spray 2 sprays in each nostril daily.  . montelukast (SINGULAIR) 10 MG tablet Take 1 tablet (10 mg total) by mouth at bedtime.  . sertraline (ZOLOFT) 100 MG tablet Take 1  tablet (100 mg total) by mouth daily.  . [DISCONTINUED] albuterol (PROVENTIL HFA;VENTOLIN HFA) 108 (90 Base) MCG/ACT inhaler Inhale 1-2 puffs into the lungs every 6 (six) hours as needed for wheezing or shortness of breath.   Allergies  Allergen Reactions  . Doxycycline Nausea And Vomiting  . Pseudoephedrine Hcl Palpitations  . Sulfa Antibiotics Rash   No results found for this or any previous visit (from the past 2160 hour(s)). Objective  Body mass index is 24.11 kg/m. Wt Readings from Last 3 Encounters:  03/12/18 127 lb 9.6 oz (57.9 kg)  12/13/17 125 lb 6.4 oz (56.9 kg)  11/01/17 124 lb 6 oz (56.4 kg)   Temp Readings from Last 3 Encounters:  03/12/18 98.2 F (36.8 C) (Oral)  12/13/17 98.4 F (36.9 C) (Oral)  11/01/17 98.1 F (36.7 C) (Oral)   BP Readings from Last 3 Encounters:  03/12/18 112/62  12/13/17 98/60  11/01/17 122/80   Pulse Readings from Last 3 Encounters:  03/12/18 91  12/13/17 84  11/01/17 74    Physical Exam  Constitutional: She is oriented to person, place, and time. Vital signs are normal. She appears well-developed and well-nourished. She is cooperative.  HENT:  Head: Normocephalic and atraumatic.  Mouth/Throat: Oropharynx is clear and moist and mucous membranes are normal.  Eyes: Pupils are equal, round, and reactive to light. Conjunctivae are normal.  Cardiovascular: Normal rate, regular rhythm and normal heart sounds.  Pulmonary/Chest: Effort normal and breath sounds normal.  Neurological: She is alert and oriented to person, place, and time. Gait normal.  Skin: Skin is warm, dry and intact.  Psychiatric: She has a normal mood and affect. Her speech is normal and behavior is normal. Judgment and thought content normal. Cognition and memory are normal.  Nursing note and vitals reviewed.   Assessment   1. Sinusitis/URI with h/o allergic asthma 2. HM Plan   1. Augmentin, flonase, zyrtec, prn albuterol  Declines steroids and tussionex for now  (see hpi about steroids) mucinex dm or robitussin dm  Prn tylenol  Given letter for work  2.  Had flu shot 2018 rec this year will need  Tdap had 05/13/14  3/3 hep B vaccines  MMR immune  Declines STD cehck  Referred mammo pt to sch re ferred again today Get records pap [facility] Dr. Melba Coon resent toady Referred derm in past as above pt has not heard check on appt  Disc screening  colonoscopy age 60 dad with precancerous polpys and grandparent colon cancer.   Provider: Dr. Olivia Mackie McLean-[facility]oc[facility]

## 2018-03-12 NOTE — Progress Notes (Signed)
Pre visit review using our clinic review tool, if applicable. No additional management support is needed unless otherwise documented below in the visit note. 

## 2018-03-12 NOTE — Patient Instructions (Signed)
Sinusitis, Adult Sinusitis is soreness and inflammation of your sinuses. Sinuses are hollow spaces in the bones around your face. Your sinuses are located:  Around your eyes.  In the middle of your forehead.  Behind your nose.  In your cheekbones.  Your sinuses and nasal passages are lined with a stringy fluid (mucus). Mucus normally drains out of your sinuses. When your nasal tissues become inflamed or swollen, the mucus can become trapped or blocked so air cannot flow through your sinuses. This allows bacteria, viruses, and funguses to grow, which leads to infection. Sinusitis can develop quickly and last for 7?10 days (acute) or for more than 12 weeks (chronic). Sinusitis often develops after a cold. What are the causes? This condition is caused by anything that creates swelling in the sinuses or stops mucus from draining, including:  Allergies.  Asthma.  Bacterial or viral infection.  Abnormally shaped bones between the nasal passages.  Nasal growths that contain mucus (nasal polyps).  Narrow sinus openings.  Pollutants, such as chemicals or irritants in the air.  A foreign object stuck in the nose.  A fungal infection. This is rare.  What increases the risk? The following factors may make you more likely to develop this condition:  Having allergies or asthma.  Having had a recent cold or respiratory tract infection.  Having structural deformities or blockages in your nose or sinuses.  Having a weak immune system.  Doing a lot of swimming or diving.  Overusing nasal sprays.  Smoking.  What are the signs or symptoms? The main symptoms of this condition are pain and a feeling of pressure around the affected sinuses. Other symptoms include:  Upper toothache.  Earache.  Headache.  Bad breath.  Decreased sense of smell and taste.  A cough that may get worse at night.  Fatigue.  Fever.  Thick drainage from your nose. The drainage is often green  and it may contain pus (purulent).  Stuffy nose or congestion.  Postnasal drip. This is when extra mucus collects in the throat or back of the nose.  Swelling and warmth over the affected sinuses.  Sore throat.  Sensitivity to light.  How is this diagnosed? This condition is diagnosed based on symptoms, a medical history, and a physical exam. To find out if your condition is acute or chronic, your health care provider may:  Look in your nose for signs of nasal polyps.  Tap over the affected sinus to check for signs of infection.  View the inside of your sinuses using an imaging device that has a light attached (endoscope).  If your health care provider suspects that you have chronic sinusitis, you may also:  Be tested for allergies.  Have a sample of mucus taken from your nose (nasal culture) and checked for bacteria.  Have a mucus sample examined to see if your sinusitis is related to an allergy.  If your sinusitis does not respond to treatment and it lasts longer than 8 weeks, you may have an MRI or CT scan to check your sinuses. These scans also help to determine how severe your infection is. In rare cases, a bone biopsy may be done to rule out more serious types of fungal sinus disease. How is this treated? Treatment for sinusitis depends on the cause and whether your condition is chronic or acute. If a virus is causing your sinusitis, your symptoms will go away on their own within 10 days. You may be given medicines to relieve your symptoms,  including:  Topical nasal decongestants. They shrink swollen nasal passages and let mucus drain from your sinuses.  Antihistamines. These drugs block inflammation that is triggered by allergies. This can help to ease swelling in your nose and sinuses.  Topical nasal corticosteroids. These are nasal sprays that ease inflammation and swelling in your nose and sinuses.  Nasal saline washes. These rinses can help to get rid of thick mucus  in your nose.  If your condition is caused by bacteria, you will be given an antibiotic medicine. If your condition is caused by a fungus, you will be given an antifungal medicine. Surgery may be needed to correct underlying conditions, such as narrow nasal passages. Surgery may also be needed to remove polyps. Follow these instructions at home: Medicines  Take, use, or apply over-the-counter and prescription medicines only as told by your health care provider. These may include nasal sprays.  If you were prescribed an antibiotic medicine, take it as told by your health care provider. Do not stop taking the antibiotic even if you start to feel better. Hydrate and Humidify  Drink enough water to keep your urine clear or pale yellow. Staying hydrated will help to thin your mucus.  Use a cool mist humidifier to keep the humidity level in your home above 50%.  Inhale steam for 10-15 minutes, 3-4 times a day or as told by your health care provider. You can do this in the bathroom while a hot shower is running.  Limit your exposure to cool or dry air. Rest  Rest as much as possible.  Sleep with your head raised (elevated).  Make sure to get enough sleep each night. General instructions  Apply a warm, moist washcloth to your face 3-4 times a day or as told by your health care provider. This will help with discomfort.  Wash your hands often with soap and water to reduce your exposure to viruses and other germs. If soap and water are not available, use hand sanitizer.  Do not smoke. Avoid being around people who are smoking (secondhand smoke).  Keep all follow-up visits as told by your health care provider. This is important. Contact a health care provider if:  You have a fever.  Your symptoms get worse.  Your symptoms do not improve within 10 days. Get help right away if:  You have a severe headache.  You have persistent vomiting.  You have pain or swelling around your face or  eyes.  You have vision problems.  You develop confusion.  Your neck is stiff.  You have trouble breathing. This information is not intended to replace advice given to you by your health care provider. Make sure you discuss any questions you have with your health care provider. Document Released: 06/05/2005 Document Revised: 01/30/2016 Document Reviewed: 03/31/2015 Elsevier Interactive Patient Education  2018 [employer].  Allergies, Adult An allergy is when your body's defense system (immune system) overreacts to an otherwise harmless substance (allergen) that you breathe in or eat or something that touches your skin. When you come into contact with something that you are allergic to, your immune system produces certain proteins (antibodies). These proteins cause cells to release chemicals (histamines) that trigger the symptoms of an allergic reaction. Allergies often affect the nasal passages (allergic rhinitis), eyes (allergic conjunctivitis), skin (atopic dermatitis), and stomach. Allergies can be mild or severe. Allergies cannot spread from person to person (are not contagious). They can develop at any age and may be outgrown. What increases the  risk? You may be at greater risk of allergies if other people in your family have allergies. What are the signs or symptoms? Symptoms depend on what type of allergy you have. They may include:  Runny, stuffy nose.  Sneezing.  Itchy mouth, ears, or throat.  Postnasal drip.  Sore throat.  Itchy, red, watery, or puffy eyes.  Skin rash or hives.  Stomach pain.  Vomiting.  Diarrhea.  Bloating.  Wheezing or coughing.  People with a severe allergy to food, medicine, or an insect bite may have a life-threatening allergic reaction (anaphylaxis). Symptoms of anaphylaxis include:  Hives.  Itching.  Flushed face.  Swollen lips, tongue, or mouth.  Tight or swollen throat.  Chest pain or tightness in the chest.  Trouble  breathing or shortness of breath.  Rapid heartbeat.  Dizziness or fainting.  Vomiting.  Diarrhea.  Pain in the abdomen.  How is this diagnosed? This condition is diagnosed based on:  Your symptoms.  Your family and medical history.  A physical exam.  You may need to see a health care provider who specializes in treating allergies (allergist). You may also have tests, including:  Skin tests to see which allergens are causing your symptoms, such as: ? Skin prick test. In this test, your skin is pricked with a tiny needle and exposed to small amounts of possible allergens to see if your skin reacts. ? Intradermal skin test. In this test, a small amount of allergen is injected under your skin to see if your skin reacts. ? Patch test. In this test, a small amount of allergen is placed on your skin and then your skin is covered with a bandage. Your health care provider will check your skin after a couple of days to see if a rash has developed.  Blood tests.  Challenges tests. In this test, you inhale a small amount of allergen by mouth to see if you have an allergic reaction.  You may also be asked to:  Keep a food diary. A food diary is a record of all the foods and drinks you have in a day and any symptoms you experience.  Practice an elimination diet. An elimination diet involves eliminating specific foods from your diet and then adding them back in one by one to find out if a certain food causes an allergic reaction.  How is this treated? Treatment for allergies depends on your symptoms. Treatment may include:  Cold compresses to soothe itching and swelling.  Eye drops.  Nasal sprays.  Using a saline spray or container (neti pot) to flush out the nose (nasal irrigation). These methods can help clear away mucus and keep the nasal passages moist.  Using a humidifier.  Oral antihistamines or other medicines to block allergic reaction and inflammation.  Skin creams to  treat rashes or itching.  Diet changes to eliminate food allergy triggers.  Repeated exposure to tiny amounts of allergens to build up a tolerance and prevent future allergic reactions (immunotherapy). These include: ? Allergy shots. ? Oral treatment. This involves taking small doses of an allergen under the tongue (sublingual immunotherapy).  Emergency epinephrine injection (auto-injector) in case of an allergic emergency. This is a self-injectable, pre-measured medicine that must be given within the first few minutes of a serious allergic reaction.  Follow these instructions at home:  Avoid known allergens whenever possible.  If you suffer from airborne allergens, wash out your nose daily. You can do this with a saline spray or a neti  pot to flush out your nose (nasal irrigation).  Take over-the-counter and prescription medicines only as told by your health care provider.  Keep all follow-up visits as told by your health care provider. This is important.  If you are at risk of a severe allergic reaction (anaphylaxis), keep your auto-injector with you at all times.  If you have ever had anaphylaxis, wear a medical alert bracelet or necklace that states you have a severe allergy. Contact a health care provider if:  Your symptoms do not improve with treatment. Get help right away if:  You have symptoms of anaphylaxis, such as: ? Swollen mouth, tongue, or throat. ? Pain or tightness in your chest. ? Trouble breathing or shortness of breath. ? Dizziness or fainting. ? Severe abdominal pain, vomiting, or diarrhea. This information is not intended to replace advice given to you by your health care provider. Make sure you discuss any questions you have with your health care provider. Document Released: 08/29/2002 Document Revised: 10/04/2016 Document Reviewed: 12/22/2015 Elsevier Interactive Patient Education  [employer].

## 2018-04-23 ENCOUNTER — Ambulatory Visit
Admission: RE | Admit: 2018-04-23 | Discharge: 2018-04-23 | Disposition: A | Discharge status: Home / Self Care | Payer: BC Managed Care – PPO | Source: Ambulatory Visit | Attending: Internal Medicine | Admitting: Internal Medicine

## 2018-04-23 DIAGNOSIS — Z1231 Encounter for screening mammogram for malignant neoplasm of breast: Secondary | ICD-10-CM | POA: Diagnosis present

## 2018-04-26 ENCOUNTER — Other Ambulatory Visit: Payer: Self-pay | Admitting: Internal Medicine

## 2018-04-26 DIAGNOSIS — J309 Allergic rhinitis, unspecified: Secondary | ICD-10-CM

## 2018-04-26 DIAGNOSIS — F419 Anxiety disorder, unspecified: Secondary | ICD-10-CM

## 2018-05-02 ENCOUNTER — Ambulatory Visit: Payer: BC Managed Care – PPO | Admitting: Family Medicine

## 2018-05-02 ENCOUNTER — Encounter: Payer: Self-pay | Admitting: Family Medicine

## 2018-05-02 VITALS — BP 120/80 | HR 90 | Temp 98.4°F | Ht 61.0 in | Wt 128.6 lb

## 2018-05-02 DIAGNOSIS — R1010 Upper abdominal pain, unspecified: Secondary | ICD-10-CM

## 2018-05-02 DIAGNOSIS — R1032 Left lower quadrant pain: Secondary | ICD-10-CM | POA: Diagnosis not present

## 2018-05-02 DIAGNOSIS — K5792 Diverticulitis of intestine, part unspecified, without perforation or abscess without bleeding: Secondary | ICD-10-CM | POA: Diagnosis not present

## 2018-05-02 DIAGNOSIS — R140 Abdominal distension (gaseous): Secondary | ICD-10-CM | POA: Diagnosis not present

## 2018-05-02 MED ORDER — OMEPRAZOLE 40 MG PO CPDR: 40.0000 mg | DELAYED_RELEASE_CAPSULE | Freq: Every day | ORAL | 0 refills | Status: DC

## 2018-05-02 MED ORDER — AMOXICILLIN-POT CLAVULANATE 875-125 MG PO TABS: 1.0000 | ORAL_TABLET | Freq: Three times a day (TID) | ORAL | 0 refills | Status: AC

## 2018-05-02 NOTE — Progress Notes (Signed)
Subjective:    Patient ID: Veronica [name], female    DOB: 1975/09/17, 42 y.o.   MRN: [id number]  HPI  Presents to clinic c/o stomach issues for the past week.  Patient states she feels bloated, and also feels pain in the upper abdomen and also left lower quadrant.  Patient states she feels like she cannot eat much, has been trying to eat some bland foods.  Has taken some over-the-counter Pepcid, but did not notice much reduction in symptoms.  Patient denies any vomiting or diarrhea.  States she usually has a bowel movement every 2 days.  Denies fever or chills.  Denies any travel to new places in which she would have experienced new foods.  Patient Active Problem List   Diagnosis Date Noted  . Hyperlipidemia 12/13/2017  . Depression 11/01/2017  . Panic attack 11/01/2017  . Cardiac murmur 11/01/2017  . Allergic rhinitis 11/01/2017  . Acne vulgaris 11/01/2017  . Acute non-recurrent maxillary sinusitis 08/16/2017  . Acute sinusitis 03/01/2017  . Annual physical exam 06/06/2016  . Anxiety 06/06/2016  . Allergy 06/06/2016   Social History   Tobacco Use  . Smoking status: Never Smoker  . Smokeless tobacco: Never Used  Substance Use Topics  . Alcohol use: Yes    Alcohol/week: 2.0 standard drinks    Types: 2 Standard drinks or equivalent per week    Comment: social   Review of Systems  Constitutional: Negative for chills, fatigue and fever.  HENT: Negative for congestion, ear pain, sinus pain and sore throat.   Eyes: Negative.   Respiratory: Negative for cough, shortness of breath and wheezing.   Cardiovascular: Negative for chest pain, palpitations and leg swelling.  Gastrointestinal: +ABD pain upper and LLQ, +bloating. No diarrhea or vomiting.  Genitourinary: Negative for dysuria, frequency and urgency.  Musculoskeletal: Negative for arthralgias and myalgias.  Skin: Negative for color change, pallor and rash.  Neurological: Negative for syncope, light-headedness and  headaches.  Psychiatric/Behavioral: The patient is not nervous/anxious.       Objective:   Physical Exam  Constitutional: No distress.  Appears non toxic  HENT:  Head: Normocephalic and atraumatic.  Eyes: Conjunctivae and EOM are normal. No scleral icterus.  Neck: No tracheal deviation present.  Cardiovascular: Normal rate and regular rhythm.  Pulmonary/Chest: Effort normal and breath sounds normal.  Abdominal: Soft. Bowel sounds are normal. There is tenderness in the epigastric area and left lower quadrant. There is no rigidity, no rebound, no guarding and no CVA tenderness.    Areas of abdominal tenderness indicated by purple circles on diagram.  Patient does appear somewhat bloated, she has a small person so you can see her stomach is more bloated than usual  Skin: She is not diaphoretic.  Nursing note and vitals reviewed.     Vitals:   05/02/18 1606  BP: 120/80  Pulse: 90  Temp: 98.4 F (36.9 C)  SpO2: 99%   Assessment & Plan:   Diverticulitis/left lower quadrant abdominal pain-due to tenderness in left lower quadrant with palpation I suspect diverticulitis is contributing to her pain.  She will take Augmentin 3 times daily for 7 days.  We will also get CBC, CMP, amylase and lipase in clinic today.  We will set her up for a CT scan tomorrow for further investigation into pain.  Upper abdominal pain/bloating-suspect this could be partially related to uncontrolled acid reflux.  Patient will start a PPI once daily, Prilosec sent to pharmacy.  Patient advised that if abdominal  pain becomes severe or worsens, she develops vomiting, diarrhea to call office right away and/or go to ER for evaluation if it is after hours.

## 2018-05-03 ENCOUNTER — Telehealth: Payer: Self-pay

## 2018-05-03 ENCOUNTER — Ambulatory Visit
Admission: RE | Admit: 2018-05-03 | Discharge: 2018-05-03 | Disposition: A | Discharge status: Home / Self Care | Payer: BC Managed Care – PPO | Source: Ambulatory Visit | Attending: Family Medicine | Admitting: Family Medicine

## 2018-05-03 ENCOUNTER — Encounter: Payer: Self-pay | Admitting: Family Medicine

## 2018-05-03 DIAGNOSIS — R1032 Left lower quadrant pain: Secondary | ICD-10-CM | POA: Insufficient documentation

## 2018-05-03 DIAGNOSIS — I700 Atherosclerosis of aorta: Secondary | ICD-10-CM | POA: Insufficient documentation

## 2018-05-03 DIAGNOSIS — Z975 Presence of (intrauterine) contraceptive device: Secondary | ICD-10-CM | POA: Diagnosis not present

## 2018-05-03 DIAGNOSIS — Q7649 Other congenital malformations of spine, not associated with scoliosis: Secondary | ICD-10-CM | POA: Diagnosis not present

## 2018-05-03 LAB — COMPREHENSIVE METABOLIC PANEL
ALK PHOS: 43 U/L (ref 39–117)
ALT: 14 U/L (ref 0–35)
AST: 17 U/L (ref 0–37)
Albumin: 4.4 g/dL (ref 3.5–5.2)
BUN: 14 mg/dL (ref 6–23)
CO2: 29 meq/L (ref 19–32)
CREATININE: 0.69 mg/dL (ref 0.40–1.20)
Calcium: 9.4 mg/dL (ref 8.4–10.5)
Chloride: 103 mEq/L (ref 96–112)
GFR: 99.55 mL/min (ref >60.00)
GLUCOSE: 107 mg/dL — AB (ref 70–99)
Potassium: 4 mEq/L (ref 3.5–5.1)
Sodium: 138 mEq/L (ref 135–145)
TOTAL PROTEIN: 7.1 g/dL (ref 6.0–8.3)
Total Bilirubin: 0.2 mg/dL (ref 0.2–1.2)

## 2018-05-03 LAB — CBC WITH DIFFERENTIAL/PLATELET
BASOS ABS: 0.1 10*3/uL (ref 0.0–0.1)
Basophils Relative: 1.2 % (ref 0.0–3.0)
EOS ABS: 0.1 10*3/uL (ref 0.0–0.7)
Eosinophils Relative: 1.4 % (ref 0.0–5.0)
HCT: 36.3 % (ref 36.0–46.0)
Hemoglobin: 12.1 g/dL (ref 12.0–15.0)
LYMPHS ABS: 3.1 10*3/uL (ref 0.7–4.0)
LYMPHS PCT: 31.8 % (ref 12.0–46.0)
MCHC: 33.4 g/dL (ref 30.0–36.0)
MCV: 84.2 fl (ref 78.0–100.0)
Monocytes Absolute: 0.7 10*3/uL (ref 0.1–1.0)
Monocytes Relative: 7.3 % (ref 3.0–12.0)
NEUTROS PCT: 58.3 % (ref 43.0–77.0)
Neutro Abs: 5.7 10*3/uL (ref 1.4–7.7)
PLATELETS: 257 10*3/uL (ref 150.0–400.0)
RBC: 4.31 Mil/uL (ref 3.87–5.11)
RDW: 13.1 % (ref 11.5–15.5)
WBC: 9.8 10*3/uL (ref 4.0–10.5)

## 2018-05-03 LAB — AMYLASE: Amylase: 44 U/L (ref 27–131)

## 2018-05-03 LAB — LIPASE: Lipase: 21 U/L (ref 11.0–59.0)

## 2018-05-03 MED ORDER — IOPAMIDOL (ISOVUE-300) INJECTION 61%
100.0000 mL | Freq: Once | INTRAVENOUS | Status: AC | PRN
  Administered 2018-05-03: 100 mL via INTRAVENOUS

## 2018-05-03 NOTE — Telephone Encounter (Signed)
Copied from CRM [phone]. Topic: General - Other >> May 03, 2018  1:59 PM Veronica [name], Veronica [name] wrote: Reason for CRM: Pt called for CT results. Pt also requested lab results from 05/02/18. Pt requests call back to discuss both CT results and 05/02/18 lab results.   **I have spoken tp patient regarding this. See results notes under labs**

## 2018-05-27 ENCOUNTER — Other Ambulatory Visit: Payer: Self-pay | Admitting: Family Medicine

## 2018-05-27 DIAGNOSIS — R1032 Left lower quadrant pain: Secondary | ICD-10-CM

## 2018-06-17 ENCOUNTER — Other Ambulatory Visit: Payer: BC Managed Care – PPO

## 2018-06-17 ENCOUNTER — Ambulatory Visit: Payer: BC Managed Care – PPO | Admitting: Internal Medicine

## 2018-06-18 ENCOUNTER — Ambulatory Visit: Payer: BC Managed Care – PPO | Admitting: Internal Medicine

## 2018-06-18 ENCOUNTER — Other Ambulatory Visit: Payer: BC Managed Care – PPO

## 2018-06-26 ENCOUNTER — Other Ambulatory Visit: Payer: Self-pay | Admitting: Internal Medicine

## 2018-06-26 DIAGNOSIS — F329 Major depressive disorder, single episode, unspecified: Secondary | ICD-10-CM

## 2018-06-26 DIAGNOSIS — F32A Depression, unspecified: Secondary | ICD-10-CM

## 2018-06-26 DIAGNOSIS — F410 Panic disorder [episodic paroxysmal anxiety] without agoraphobia: Secondary | ICD-10-CM

## 2018-06-26 DIAGNOSIS — F419 Anxiety disorder, unspecified: Secondary | ICD-10-CM

## 2018-06-26 MED ORDER — LORAZEPAM 0.5 MG PO TABS: ORAL_TABLET | ORAL | 0 refills | Status: DC

## 2018-07-26 ENCOUNTER — Other Ambulatory Visit: Payer: Self-pay | Admitting: Internal Medicine

## 2018-07-26 DIAGNOSIS — J309 Allergic rhinitis, unspecified: Secondary | ICD-10-CM

## 2018-07-26 DIAGNOSIS — F419 Anxiety disorder, unspecified: Secondary | ICD-10-CM

## 2018-07-26 MED ORDER — SERTRALINE HCL 100 MG PO TABS: 100.0000 mg | ORAL_TABLET | Freq: Every day | ORAL | 2 refills | Status: DC

## 2018-07-26 MED ORDER — MONTELUKAST SODIUM 10 MG PO TABS: 10.0000 mg | ORAL_TABLET | Freq: Every day | ORAL | 2 refills | Status: DC

## 2018-10-25 ENCOUNTER — Telehealth: Payer: Self-pay

## 2018-10-25 ENCOUNTER — Ambulatory Visit (INDEPENDENT_AMBULATORY_CARE_PROVIDER_SITE_OTHER): Payer: BC Managed Care – PPO | Admitting: Internal Medicine

## 2018-10-25 ENCOUNTER — Other Ambulatory Visit: Payer: Self-pay

## 2018-10-25 ENCOUNTER — Encounter: Payer: Self-pay | Admitting: Internal Medicine

## 2018-10-25 DIAGNOSIS — Z1389 Encounter for screening for other disorder: Secondary | ICD-10-CM

## 2018-10-25 DIAGNOSIS — R5383 Other fatigue: Secondary | ICD-10-CM

## 2018-10-25 DIAGNOSIS — Z13220 Encounter for screening for lipoid disorders: Secondary | ICD-10-CM

## 2018-10-25 DIAGNOSIS — Z1329 Encounter for screening for other suspected endocrine disorder: Secondary | ICD-10-CM

## 2018-10-25 DIAGNOSIS — N6320 Unspecified lump in the left breast, unspecified quadrant: Secondary | ICD-10-CM

## 2018-10-25 DIAGNOSIS — Z0000 Encounter for general adult medical examination without abnormal findings: Secondary | ICD-10-CM

## 2018-10-25 NOTE — Progress Notes (Signed)
Virtual Visit via Video Note  I connected with Veronica [name]   on 10/25/18 at 10:40 AM EDT by a video enabled telemedicine application and verified that I am speaking with the correct person using two identifiers.  Location patient: home Location provider:work  Persons participating in the virtual visit: patient, provider  I discussed the limitations of evaluation and management by telemedicine and the availability of in person appointments. The patient expressed understanding and agreed to proceed.   HPI: 1. Fatigue x 2 weeks though being physically active and eating healthy and sleeping 8 hours qhs and working out this is new. Declines to repeat labs now. Mood and anxiety controlled  2. C/o left breast pee sized lesion ? If painful she brushed against it with her hand and felt it at top of left breast left of areolar and 9-10 oclock pea sized   ROS: See pertinent positives and negatives per HPI.  Past Medical History:  Diagnosis Date  . Allergy    cedar, tree, dogs, cats   . Anxiety 06/06/2016  . Asthma    allergic   . Depression   . GERD (gastroesophageal reflux disease)   . Hyperlipidemia   . Hypoglycemia     Past Surgical History:  Procedure Laterality Date  . DILATION AND CURETTAGE OF UTERUS    . vocal cord surgery      Family History  Problem Relation Age of Onset  . Hyperlipidemia Mother   . Heart disease Father        heart valve issues s/p heart surgery   . Hypertension Father   . Diabetes Father   . Colon polyps Father        precancerous  . Arthritis Maternal Grandmother   . Stroke Maternal Grandmother   . Hyperlipidemia Maternal Grandmother   . Hypertension Maternal Grandfather   . Colon cancer Paternal Grandmother   . Hypertension Paternal Grandfather   . Diabetes Paternal Grandfather   . Breast cancer Neg Hx     SOCIAL HX: separated from husband   Current Outpatient Medications:  .  albuterol (PROVENTIL HFA;VENTOLIN HFA) 108 (90 Base) MCG/ACT  inhaler, Inhale 1-2 puffs into the lungs every 6 (six) hours as needed for wheezing or shortness of breath., Disp: 1 Inhaler, Rfl: 12 .  cetirizine (ZYRTEC) 10 MG tablet, Take 1 tablet (10 mg total) by mouth daily., Disp: 90 tablet, Rfl: 3 .  Clindamycin-Benzoyl Per, Refr, gel, Apply topically twice daily., Disp: 45 g, Rfl: 11 .  fluticasone (FLONASE) 50 MCG/ACT nasal spray, Place 2 sprays into both nostrils daily. prn, Disp: 16 g, Rfl: 16 .  levonorgestrel (MIRENA) 20 MCG/24HR IUD, 1 each by Intrauterine route once., Disp: , Rfl:  .  LORazepam (ATIVAN) 0.5 MG tablet, 1/2 pill at night as needed prn anxiety, Disp: 20 tablet, Rfl: 0 .  mometasone (NASONEX) 50 MCG/ACT nasal spray, 2 sprays in each nostril daily., Disp: 17 g, Rfl: 2 .  montelukast (SINGULAIR) 10 MG tablet, Take 1 tablet (10 mg total) by mouth at bedtime., Disp: 90 tablet, Rfl: 2 .  omeprazole (PRILOSEC) 40 MG capsule, TAKE 1 CAPSULE(40 MG) BY MOUTH DAILY, Disp: 30 capsule, Rfl: 0 .  sertraline (ZOLOFT) 100 MG tablet, Take 1 tablet (100 mg total) by mouth daily., Disp: 90 tablet, Rfl: 2  EXAM:  VITALS per patient if applicable:  GENERAL: alert, oriented, appears well and in no acute distress  HEENT: atraumatic, conjunttiva clear, no obvious abnormalities on inspection of external nose and ears  NECK: normal movements of the head and neck  LUNGS: on inspection no signs of respiratory distress, breathing rate appears normal, no obvious gross SOB, gasping or wheezing  CV: no obvious cyanosis  MS: moves all visible extremities without noticeable abnormality  PSYCH/NEURO: pleasant and cooperative, no obvious depression or anxiety, speech and thought processing grossly intact  Breast-left breast mass pea sized 9-10 oclock left breast   ASSESSMENT AND PLAN:  Discussed the following assessment and plan:  Left breast mass - Plan: MM DIAG BREAST TOMO UNI LEFT, US BREAST LTD UNI LEFT INC AXILLA  Fatigue-declines w/u with labs  for now   HM Labs due 12/05/2018 or after  Had flu shot 2018rec this year will need  Tdap had 05/13/14 3/3 hep B vaccines  MMR immune  Declines STD cehck  Referred mammo pt to sch re ferred again today Get records pap [facility] Dr. Melba Coon resent toady Referred derm in past as abovept has not heard check on appt Disc screening colonoscopy age 58 dad with precancerous polpys and grandparent colon cancer.    I discussed the assessment and treatment plan with the patient. The patient was provided an opportunity to ask questions and all were answered. The patient agreed with the plan and demonstrated an understanding of the instructions.   The patient was advised to call back or seek an in-person evaluation if the symptoms worsen or if the condition fails to improve as anticipated.  Time 15 minutes  Delorise Jackson, MD

## 2018-10-25 NOTE — Telephone Encounter (Signed)
Copied from CRM [id number]. Topic: General - Other >> Oct 25, 2018  9:24 AM Jaquita Rector A wrote: Reason for CRM: Patient called to say that she have found a lump in her left breast it feels a little bigger than a pea in the middle of the breast. Asking what does she need to do, found this during her self examination. Please call patient at Ph# [phone]  Called and scheduled a appt with the provider for today.  Clarity Ciszek,cma

## 2018-10-28 ENCOUNTER — Telehealth: Payer: Self-pay | Admitting: *Deleted

## 2018-10-28 NOTE — Telephone Encounter (Signed)
The patient has been scheduled . I left a return message on Veronica [name]'s phone to inform her that the patient had been scheduled.

## 2018-10-28 NOTE — Telephone Encounter (Signed)
Copied from CRM [id number]. Topic: General - Other >> Oct 28, 2018  8:29 AM Fanny Bien wrote: Reason for CRM: jamie calling from [facility] imaging called and stated that Erie Noe called her. She would like a call back [phone]

## 2018-10-30 ENCOUNTER — Ambulatory Visit
Admission: RE | Admit: 2018-10-30 | Discharge: 2018-10-30 | Disposition: A | Discharge status: Home / Self Care | Payer: BC Managed Care – PPO | Source: Ambulatory Visit | Attending: Internal Medicine | Admitting: Internal Medicine

## 2018-10-30 ENCOUNTER — Other Ambulatory Visit: Payer: Self-pay

## 2018-10-30 DIAGNOSIS — N6320 Unspecified lump in the left breast, unspecified quadrant: Secondary | ICD-10-CM

## 2019-01-06 ENCOUNTER — Other Ambulatory Visit: Payer: Self-pay

## 2019-01-06 DIAGNOSIS — Z20822 Contact with and (suspected) exposure to covid-19: Secondary | ICD-10-CM

## 2019-01-10 LAB — NOVEL CORONAVIRUS, NAA: SARS-CoV-2, NAA: NOT DETECTED

## 2019-01-24 ENCOUNTER — Telehealth: Payer: Self-pay | Admitting: Internal Medicine

## 2019-01-24 NOTE — Telephone Encounter (Signed)
Placed in pcp email to fill out.

## 2019-01-24 NOTE — Telephone Encounter (Signed)
Pt dropped off for to have permission to tele work. Form is in color folder up front. Pt would like a call once form has been filled out. Thank you!

## 2019-01-29 ENCOUNTER — Encounter: Payer: Self-pay | Admitting: Internal Medicine

## 2019-01-29 DIAGNOSIS — J45909 Unspecified asthma, uncomplicated: Secondary | ICD-10-CM | POA: Insufficient documentation

## 2019-01-29 DIAGNOSIS — Z0279 Encounter for issue of other medical certificate: Secondary | ICD-10-CM

## 2019-05-05 ENCOUNTER — Telehealth: Payer: Self-pay | Admitting: *Deleted

## 2019-05-05 NOTE — Telephone Encounter (Signed)
Copied from [place] [id number]. Topic: General - Other >> May 05, 2019  4:09 PM Celene Kras wrote: Reason for CRM: Pt called and is requesting to set up a virtual visit with PCP regarding her dosage on zoloft. Pt is requesting to be seen as soon as possible. Please advise.

## 2019-05-05 NOTE — Telephone Encounter (Signed)
Scheduled Virtual for 05/07/19 ok ?

## 2019-05-07 ENCOUNTER — Ambulatory Visit (INDEPENDENT_AMBULATORY_CARE_PROVIDER_SITE_OTHER): Payer: BC Managed Care – PPO | Admitting: Internal Medicine

## 2019-05-07 ENCOUNTER — Encounter: Payer: Self-pay | Admitting: Internal Medicine

## 2019-05-07 ENCOUNTER — Other Ambulatory Visit: Payer: Self-pay

## 2019-05-07 ENCOUNTER — Encounter: Payer: Self-pay | Admitting: Gastroenterology

## 2019-05-07 VITALS — Ht 60.0 in | Wt 128.0 lb

## 2019-05-07 DIAGNOSIS — F419 Anxiety disorder, unspecified: Secondary | ICD-10-CM | POA: Diagnosis not present

## 2019-05-07 DIAGNOSIS — K5909 Other constipation: Secondary | ICD-10-CM

## 2019-05-07 DIAGNOSIS — Z1211 Encounter for screening for malignant neoplasm of colon: Secondary | ICD-10-CM | POA: Diagnosis not present

## 2019-05-07 DIAGNOSIS — F329 Major depressive disorder, single episode, unspecified: Secondary | ICD-10-CM

## 2019-05-07 DIAGNOSIS — K921 Melena: Secondary | ICD-10-CM

## 2019-05-07 MED ORDER — BUPROPION HCL ER (XL) 150 MG PO TB24: 150.0000 mg | ORAL_TABLET | Freq: Every day | ORAL | 3 refills | Status: DC

## 2019-05-07 NOTE — Progress Notes (Signed)
Virtual Visit via Video Note  I connected with Veronica [name]   on 05/07/19 at  1:00 PM EST by a video enabled telemedicine application and verified that I am speaking with the correct person using two identifiers.  Location patient: home Location provider:work or home office Persons participating in the virtual visit: patient, provider  I discussed the limitations of evaluation and management by telemedicine and the availability of in person appointments. The patient expressed understanding and agreed to proceed.   HPI: 1. Chronic constipation with thin soft stools doing flax seek otc laxative, probiotics fiber gummies she has blood in stool sometimes with FH colon cancer and dad with precancerous polyps she also has hemorrhoids  2. 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 Therapist worried about her being flat and lack of motivation and increased sleep. zoloft 100 mg is helping and she does not feel horrible everyday. She also reports reduced appetite    ROS: See pertinent positives and negatives per HPI.  Past Medical History:  Diagnosis Date  . Allergy    cedar, tree, dogs, cats   . Anxiety 06/06/2016  . Asthma    allergic   . Depression   . GERD (gastroesophageal reflux disease)   . Hyperlipidemia   . Hypoglycemia     Past Surgical History:  Procedure Laterality Date  . DILATION AND CURETTAGE OF UTERUS    . vocal cord surgery      Family History  Problem Relation Age of Onset  . Hyperlipidemia Mother   . Heart disease Father        heart valve issues s/p heart surgery   . Hypertension Father   . Diabetes Father   . Colon polyps Father        precancerous  . Arthritis Maternal Grandmother   . Stroke Maternal Grandmother   . Hyperlipidemia Maternal  Grandmother   . Hypertension Maternal Grandfather   . Colon cancer Paternal Grandmother   . Hypertension Paternal Grandfather   . Diabetes Paternal Grandfather   . Breast cancer Neg Hx     SOCIAL HX:  Pending divorce    Current Outpatient Medications:  .  albuterol (PROVENTIL HFA;VENTOLIN HFA) 108 (90 Base) MCG/ACT inhaler, Inhale 1-2 puffs into the lungs every 6 (six) hours as needed for wheezing or shortness of breath., Disp: 1 Inhaler, Rfl: 12 .  cetirizine (ZYRTEC) 10 MG tablet, Take 1 tablet (10 mg total) by mouth daily., Disp: 90 tablet, Rfl: 3 .  Clindamycin-Benzoyl Per, Refr, gel, Apply topically twice daily., Disp: 45 g, Rfl: 11 .  fluticasone (FLONASE) 50 MCG/ACT nasal spray, Place 2 sprays into both nostrils daily. prn, Disp: 16 g, Rfl: 16 .  levonorgestrel (MIRENA) 20 MCG/24HR IUD, 1 each by Intrauterine route once., Disp: , Rfl:  .  LORazepam (ATIVAN) 0.5 MG tablet, 1/2 pill at night as needed prn anxiety, Disp: 20 tablet, Rfl: 0 .  mometasone (NASONEX) 50 MCG/ACT nasal spray, 2 sprays in each nostril daily., Disp: 17 g, Rfl: 2 .  montelukast (SINGULAIR) 10 MG tablet, Take 1 tablet (10 mg total) by mouth at bedtime., Disp: 90 tablet, Rfl: 2 .  omeprazole (PRILOSEC) 40 MG capsule, TAKE 1 CAPSULE(40 MG) BY MOUTH DAILY, Disp: 30 capsule, Rfl:  0 .  sertraline (ZOLOFT) 100 MG tablet, Take 1 tablet (100 mg total) by mouth daily., Disp: 90 tablet, Rfl: 2 .  buPROPion (WELLBUTRIN XL) 150 MG 24 hr tablet, Take 1 tablet (150 mg total) by mouth daily., Disp: 90 tablet, Rfl: 3  EXAM:  VITALS per patient if applicable:  GENERAL: alert, oriented, appears well and in no acute distress  HEENT: atraumatic, conjunttiva clear, no obvious abnormalities on inspection of external nose and ears  NECK: normal movements of the head and neck  LUNGS: on inspection no signs of respiratory distress, breathing rate appears normal, no obvious gross SOB, gasping or wheezing  CV: no obvious  cyanosis  MS: moves all visible extremities without noticeable abnormality  PSYCH/NEURO: pleasant and cooperative, no obvious depression or anxiety, speech and thought processing grossly intact  ASSESSMENT AND PLAN:  Discussed the following assessment and plan:  Anxiety and depression - Plan: buPROPion (WELLBUTRIN XL) 150 MG 24 hr tablet to zoloft 100 mg qd  GAD 7 score 5 and PHQ 9 11   Chronic constipation - Plan: Ambulatory referral to Gastroenterology Blood in stool - Plan: Ambulatory referral to Gastroenterology Encounter for screening colonoscopy - Plan: Ambulatory referral to Gastroenterology  HM Labs due 12/05/2018 or after will call to schedule  Had flu shot 2018rec this year will need Tdap had 05/13/14 3/3 hep B vaccinescheck titer  MMR immune  Declines STD cehck  10/30/18 mammo cyst left breast and otherwise neg   Get records pap [facility] Dr. Lurline Idol will call and sch  Referredderm in pastpt saw and had check up 2019 vs 2020  colonoscopy age rec61 dad with precancerous polpys and grandparent colon cancer -due to current sx's referred now   -we discussed possible serious and likely etiologies, options for evaluation and workup, limitations of telemedicine visit vs in person visit, treatment, treatment risks and precautions. Pt prefers to treat via telemedicine empirically rather then risking or undertaking an in person visit at this moment. Patient agrees to seek prompt in person care if worsening, new symptoms arise, or if is not improving with treatment.   I discussed the assessment and treatment plan with the patient. The patient was provided an opportunity to ask questions and all were answered. The patient agreed with the plan and demonstrated an understanding of the instructions.   The patient was advised to call back or seek an in-person evaluation if the symptoms worsen or if the condition fails to improve as anticipated.   Nino Glow McLean-Scocuzza,  MD

## 2019-05-08 ENCOUNTER — Telehealth: Payer: Self-pay | Admitting: Internal Medicine

## 2019-05-08 NOTE — Telephone Encounter (Signed)
I called pt and left a vm to call ofc to schedule Return for sch fasting labs asap f/u in 3 months.

## 2019-05-20 ENCOUNTER — Other Ambulatory Visit: Payer: Self-pay

## 2019-05-20 ENCOUNTER — Other Ambulatory Visit (INDEPENDENT_AMBULATORY_CARE_PROVIDER_SITE_OTHER): Payer: BC Managed Care – PPO

## 2019-05-20 DIAGNOSIS — Z1389 Encounter for screening for other disorder: Secondary | ICD-10-CM

## 2019-05-20 DIAGNOSIS — Z0000 Encounter for general adult medical examination without abnormal findings: Secondary | ICD-10-CM | POA: Diagnosis not present

## 2019-05-20 DIAGNOSIS — Z13220 Encounter for screening for lipoid disorders: Secondary | ICD-10-CM

## 2019-05-20 DIAGNOSIS — Z1329 Encounter for screening for other suspected endocrine disorder: Secondary | ICD-10-CM

## 2019-05-20 LAB — TSH: TSH: 0.81 u[IU]/mL (ref 0.35–4.50)

## 2019-05-20 LAB — COMPREHENSIVE METABOLIC PANEL
ALT: 13 U/L (ref 0–35)
AST: 15 U/L (ref 0–37)
Albumin: 4.5 g/dL (ref 3.5–5.2)
Alkaline Phosphatase: 48 U/L (ref 39–117)
BUN: 14 mg/dL (ref 6–23)
CO2: 29 mEq/L (ref 19–32)
Calcium: 9.4 mg/dL (ref 8.4–10.5)
Chloride: 102 mEq/L (ref 96–112)
Creatinine, Ser: 0.7 mg/dL (ref 0.40–1.20)
GFR: 91.65 mL/min (ref >60.00)
Glucose, Bld: 92 mg/dL (ref 70–99)
Potassium: 4 mEq/L (ref 3.5–5.1)
Sodium: 137 mEq/L (ref 135–145)
Total Bilirubin: 0.4 mg/dL (ref 0.2–1.2)
Total Protein: 6.9 g/dL (ref 6.0–8.3)

## 2019-05-20 LAB — LIPID PANEL
Cholesterol: 216 mg/dL — ABNORMAL HIGH (ref 0–200)
HDL: 48.2 mg/dL (ref >39.00)
LDL Cholesterol: 148 mg/dL — ABNORMAL HIGH (ref 0–99)
NonHDL: 168.07
Total CHOL/HDL Ratio: 4
Triglycerides: 100 mg/dL (ref 0.0–149.0)
VLDL: 20 mg/dL (ref 0.0–40.0)

## 2019-05-20 LAB — CBC WITH DIFFERENTIAL/PLATELET
Basophils Absolute: 0 10*3/uL (ref 0.0–0.1)
Basophils Relative: 0.4 % (ref 0.0–3.0)
Eosinophils Absolute: 0.1 10*3/uL (ref 0.0–0.7)
Eosinophils Relative: 1.6 % (ref 0.0–5.0)
HCT: 37.5 % (ref 36.0–46.0)
Hemoglobin: 12.3 g/dL (ref 12.0–15.0)
Lymphocytes Relative: 31 % (ref 12.0–46.0)
Lymphs Abs: 2.2 10*3/uL (ref 0.7–4.0)
MCHC: 32.8 g/dL (ref 30.0–36.0)
MCV: 86.6 fl (ref 78.0–100.0)
Monocytes Absolute: 0.5 10*3/uL (ref 0.1–1.0)
Monocytes Relative: 6.8 % (ref 3.0–12.0)
Neutro Abs: 4.3 10*3/uL (ref 1.4–7.7)
Neutrophils Relative %: 60.2 % (ref 43.0–77.0)
Platelets: 246 10*3/uL (ref 150.0–400.0)
RBC: 4.34 Mil/uL (ref 3.87–5.11)
RDW: 13.4 % (ref 11.5–15.5)
WBC: 7.1 10*3/uL (ref 4.0–10.5)

## 2019-05-20 LAB — T4, FREE: Free T4: 0.75 ng/dL (ref 0.60–1.60)

## 2019-05-21 LAB — TIQ-MISC

## 2019-06-06 ENCOUNTER — Other Ambulatory Visit: Payer: Self-pay | Admitting: Internal Medicine

## 2019-06-06 DIAGNOSIS — Z1389 Encounter for screening for other disorder: Secondary | ICD-10-CM

## 2019-06-06 NOTE — Progress Notes (Signed)
I apologize for the delay in my response to your question concerning this patient and any others from the recent past. I was unaware that it was my responsibility to address these issues until this afternoon. Now that I know -I did call [facility] & received an explanation for the [facility]. [place] stands for test in question. Apparently the urine specimen (container) the lab received did not match up to any orders placed. It appears that a urine culture tube plus the remaining urine in a sterile cup was sent off, where [facility] requires a specific tube for UA/reflex micro.

## 2019-06-09 ENCOUNTER — Other Ambulatory Visit: Payer: BC Managed Care – PPO

## 2019-06-09 ENCOUNTER — Other Ambulatory Visit: Payer: Self-pay

## 2019-06-09 ENCOUNTER — Ambulatory Visit: Payer: BC Managed Care – PPO | Attending: Internal Medicine

## 2019-06-09 DIAGNOSIS — Z1389 Encounter for screening for other disorder: Secondary | ICD-10-CM

## 2019-06-09 DIAGNOSIS — Z20822 Contact with and (suspected) exposure to covid-19: Secondary | ICD-10-CM

## 2019-06-10 LAB — URINALYSIS, ROUTINE W REFLEX MICROSCOPIC
Bilirubin Urine: NEGATIVE
Glucose, UA: NEGATIVE
Hgb urine dipstick: NEGATIVE
Ketones, ur: NEGATIVE
Leukocytes,Ua: NEGATIVE
Nitrite: NEGATIVE
Protein, ur: NEGATIVE
Specific Gravity, Urine: 1.016 (ref 1.001–1.03)
pH: 7.5 (ref 5.0–8.0)

## 2019-06-10 LAB — NOVEL CORONAVIRUS, NAA: SARS-CoV-2, NAA: NOT DETECTED

## 2019-06-23 ENCOUNTER — Ambulatory Visit: Payer: BC Managed Care – PPO | Admitting: Gastroenterology

## 2019-06-23 ENCOUNTER — Encounter: Payer: Self-pay | Admitting: Gastroenterology

## 2019-06-23 DIAGNOSIS — R194 Change in bowel habit: Secondary | ICD-10-CM | POA: Diagnosis not present

## 2019-06-23 DIAGNOSIS — R140 Abdominal distension (gaseous): Secondary | ICD-10-CM | POA: Diagnosis not present

## 2019-06-23 DIAGNOSIS — Z1159 Encounter for screening for other viral diseases: Secondary | ICD-10-CM

## 2019-06-23 DIAGNOSIS — K625 Hemorrhage of anus and rectum: Secondary | ICD-10-CM

## 2019-06-23 DIAGNOSIS — R109 Unspecified abdominal pain: Secondary | ICD-10-CM | POA: Diagnosis not present

## 2019-06-23 MED ORDER — POLYETHYLENE GLYCOL 3350 17 G PO PACK: 17.0000 g | PACK | Freq: Every day | ORAL | 0 refills | Status: DC

## 2019-06-23 MED ORDER — OMEPRAZOLE 20 MG PO CPDR: 20.0000 mg | DELAYED_RELEASE_CAPSULE | Freq: Every day | ORAL | 1 refills | Status: DC

## 2019-06-23 MED ORDER — SUPREP BOWEL PREP KIT 17.5-3.13-1.6 GM/177ML PO SOLN: ORAL | 0 refills | Status: DC

## 2019-06-23 MED ORDER — DICYCLOMINE HCL 10 MG PO CAPS: 10.0000 mg | ORAL_CAPSULE | Freq: Three times a day (TID) | ORAL | 1 refills | Status: DC | PRN

## 2019-06-23 NOTE — Progress Notes (Signed)
HPI :  44 y/o female with a history of GERD, anxiety / depression, allergies, referred by [place] Ana Mclean-Scocuzza MD for abdominal pain / rectal bleeding.  Patient states she has had longstanding constipation for years.  She typically has 1 bowel movement every few days or so.  Historically she has used very high fiber but diet with fiber supplementation, and probiotics, as well as taking flaxseed.  She has a lot of gas and bloating which can bother her.  She has had a change in bowel habits a few weeks prior to Christmas, she states her stools were black in color, perhaps with some blood.  She states her bowels were like this for about 2 weeks until normal stool color returned.  She denied any Pepto-Bismol, iron supplementation, or any food ingestions which would typically cause this.  Since that has stopped however she has now had some bright red blood in her stools for the past few weeks.  Her stool frequency has remained the same, once every few days.  She has abdominal discomfort which bothers her.  She has had pains in her right lower quadrant and in her left lower quadrant which come and go.  This is been going on for the past few months.  She thinks she has pains there most of the time, but it does come and go.  Sometimes can have pain with bowel movements.  She also has epigastric pain which can bother her.  This can sometimes worsen with eating although also has it without eating and can wake her up at times.  She denies any radiation to the right upper quadrant or right shoulder.  She has some pyrosis/reflux and takes Nexium as needed which helps her reflux symptoms.  She has not taken Nexium on a daily basis yet.  She does not think the Nexium is helping much of her pain otherwise.  She denies any significant NSAID use, she is ibuprofen occasionally.  She denies any nausea or vomiting.  No weight loss.  Her grandmother had colon cancer in her 52s, her father had multiple polyps removed.  She has  never had an endoscopy or colonoscopy.  She did have a CT scan in November 2019 for abdominal pain, she was told she had a ruptured ovarian cyst at the time, no problems with her bowels were noted.  She has had a trial of MiraLAX in the past, she states she thinks it made her feel poorly, perhaps made her abdominal cramps worse.  She feels exhausted after having a bowel movement and very fatigued.  No known family history of IBD or celiac disease.  Labs earlier in December showed no anemia, normal white blood cell count, normal LFTs.  CT scan abdomen / pelvis with contrast 05/03/2018 - IMPRESSION: 1. Complex loculated fluid in the cul-de-sac region. Question hemorrhagic fluid from recent ovarian cyst rupture. Infected fluid could present similarly. No pelvic mass evident.  2. Stool throughout colon. Question a degree of constipation. No evident bowel obstruction. No abscess in the abdomen pelvis. Appendix appears normal with the exception of a small appendicolith within the appendix. No appendiceal region inflammation evident.  3.  No evident renal or ureteral calculus.  No hydronephrosis.  3. 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 Clinical significance of this finding uncertain.  4.  Mild aortic atherosclerosis.  No  5.  Intrauterine device positioned in the endometrium   Past Medical History:  Diagnosis Date  . Allergy    cedar, tree, dogs,  cats   . Anxiety 06/06/2016  . Asthma    allergic   . Depression   . GERD (gastroesophageal reflux disease)   . Hyperlipidemia   . Hypoglycemia      Past Surgical History:  Procedure Laterality Date  . DILATION AND CURETTAGE OF UTERUS    . vocal cord surgery     Family History  Problem Relation Age of Onset  . Hyperlipidemia Mother   . Heart disease Father        heart valve issues s/p heart surgery   . Hypertension Father   . Diabetes Father   . Colon polyps Father        precancerous  . Arthritis Maternal  Grandmother   . Stroke Maternal Grandmother   . Hyperlipidemia Maternal Grandmother   . Hypertension Maternal Grandfather   . Colon cancer Paternal Grandmother   . Hypertension Paternal Grandfather   . Diabetes Paternal Grandfather   . Breast cancer Neg Hx    Social History   Tobacco Use  . Smoking status: Never Smoker  . Smokeless tobacco: Never Used  Substance Use Topics  . Alcohol use: Yes    Alcohol/week: 2.0 standard drinks    Types: 2 Standard drinks or equivalent per week    Comment: social  . Drug use: No   Current Outpatient Medications  Medication Sig Dispense Refill  . albuterol (PROVENTIL HFA;VENTOLIN HFA) 108 (90 Base) MCG/ACT inhaler Inhale 1-2 puffs into the lungs every 6 (six) hours as needed for wheezing or shortness of breath. 1 Inhaler 12  . buPROPion (WELLBUTRIN XL) 150 MG 24 hr tablet Take 1 tablet (150 mg total) by mouth daily. 90 tablet 3  . cetirizine (ZYRTEC) 10 MG tablet Take 1 tablet (10 mg total) by mouth daily. 90 tablet 3  . Cholecalciferol (VITAMIN D-3) 125 MCG (5000 UT) TABS Take 1 tablet by mouth daily.    . Esomeprazole Magnesium (NEXIUM 24HR) 20 MG TBEC Take 1 capsule by mouth as needed.    Marland Kitchen levonorgestrel (MIRENA) 20 MCG/24HR IUD 1 each by Intrauterine route once.    Marland Kitchen LORazepam (ATIVAN) 0.5 MG tablet 1/2 pill at night as needed prn anxiety 20 tablet 0  . montelukast (SINGULAIR) 10 MG tablet Take 1 tablet (10 mg total) by mouth at bedtime. 90 tablet 2  . Probiotic Product (PROBIOTIC PO) Take 1 tablet by mouth daily.    . sertraline (ZOLOFT) 100 MG tablet Take 1 tablet (100 mg total) by mouth daily. 90 tablet 2   No current facility-administered medications for this visit.   Allergies  Allergen Reactions  . Doxycycline Nausea And Vomiting  . Pseudoephedrine Hcl Palpitations  . Sulfa Antibiotics Rash     Review of Systems: All systems reviewed and negative except where noted in HPI.    Lab Results  Component Value Date   ALT 13  05/20/2019   AST 15 05/20/2019   ALKPHOS 48 05/20/2019   BILITOT 0.4 05/20/2019    Lab Results  Component Value Date   CREATININE 0.70 05/20/2019   BUN 14 05/20/2019   NA 137 05/20/2019   K 4.0 05/20/2019   CL 102 05/20/2019   CO2 29 05/20/2019    Lab Results  Component Value Date   CREATININE 0.70 05/20/2019   BUN 14 05/20/2019   NA 137 05/20/2019   K 4.0 05/20/2019   CL 102 05/20/2019   CO2 29 05/20/2019    Physical Exam: Temp 98 F (36.7 C)   Ht 5'  0.5" (1.537 m) Comment: height measured without shoes  Wt 132 lb (59.9 kg)   BMI 25.36 kg/m  Constitutional: Pleasant,well-developed, female in no acute distress. HEENT: Normocephalic and atraumatic. Conjunctivae are normal. No scleral icterus. Neck supple.  Cardiovascular: Normal rate, regular rhythm.  Pulmonary/chest: Effort normal and breath sounds normal. No wheezing, rales or rhonchi. Abdominal: Soft, nondistended, some lower abdominal TTP.  There are no masses palpable. No hepatomegaly. Extremities: no edema Lymphadenopathy: No cervical adenopathy noted. Neurological: Alert and oriented to person place and time. Skin: Skin is warm and dry. No rashes noted. Psychiatric: Normal mood and affect. Behavior is normal.   ASSESSMENT AND PLAN: 44 year old female here for new patient assessment of the following:  Rectal bleeding / altered bowel habits / abdominal pain/ bloating - history as outlined above, having intermittent rectal bleeding with intermittents pains in multiple areas of her abdomen as outlined.  Some occasional epigastric postprandial symptoms, other times pain bothers her without clear triggers.  She clearly has some chronic constipation, and she may have an underlying functional bowel disorder/IBS with bowel spasm causing her symptoms however given her bleeding symptoms this warrants further evaluation with endoscopy to rule out IBD, and bleeding polyp/mass lesion.  I discussed endoscopy and colonoscopy  with her as well of anesthesia, risks and benefits of all of this, and following this discussion she wanted to proceed.  In the interim I recommend she take omeprazole 20 mg every day to see if this will reduce her epigastric discomfort.  I recommend that she decrease her fiber supplementation and probiotics given they do not seem to be helping and I think could be contributing to the gas and bloating she is having.  If she wants to take a fiber supplement I would use Citrucel as it has less chance for causing bloating.  I think she needs something stronger for her constipation than fiber however, I recommend MiraLAX starting daily and titrate up as needed if she tolerates it.  I would avoid stronger stimulant laxatives right now given her abdominal cramping with other regimens in the past. I will otherwise give her a trial of Bentyl 10 mg, 1 to 2 tablets every 8 hours as needed for abdominal cramps and see if this helps her until she can get her endoscopy and colonoscopy performed.  For the EGD will plan on ruling out celiac disease and H. pylori during that exam if nothing else is obviously noted.  Further recommendations pending her course and the results of her exams  I spent 45-59 minutes of time, including in depth chart review, independent review of results as outlined above, communicating results with the patient directly, face-to-face time with the patient, coordinating care, and ordering studies and medications as appropriate. Greater than 50% of the time was spent counseling and coordinating care.    Hutchinson Cellar, MD [facility]  CC: McLean-Scocuzza, Olivia Mackie *

## 2019-06-23 NOTE — Patient Instructions (Addendum)
If you are age 43 or older, your body mass index should be between 23-30. Your Body mass index is 25.36 kg/m. If this is out of the aforementioned range listed, please consider follow up with your Primary Care Provider.  If you are age 9 or younger, your body mass index should be between 19-25. Your Body mass index is 25.36 kg/m. If this is out of the aformentioned range listed, please consider follow up with your Primary Care Provider.   You have been scheduled for an endoscopy and colonoscopy. Please follow the written instructions given to you at your visit today. Please pick up your prep supplies at the pharmacy within the next 1-3 days. If you use inhalers (even only as needed), please bring them with you on the day of your procedure.  We have sent the following medications to your pharmacy for you to pick up at your convenience:  START Bentyl 10mg  take 1 to 2 tablets every 8 hours as needed START Omeprazole 20mg  one tablet daily  (use in place of Nexium)  Please purchase the following medications over the counter and take as directed:  Miralax take daily and titrate up as needed.   Thank you for entrusting me with your care and for choosing [facility], Dr. 

## 2019-06-26 ENCOUNTER — Encounter: Payer: Self-pay | Admitting: Gastroenterology

## 2019-06-26 ENCOUNTER — Telehealth: Payer: Self-pay | Admitting: Gastroenterology

## 2019-06-26 NOTE — Telephone Encounter (Signed)
Pt called stating that her insurance does not cover suprep.

## 2019-06-27 ENCOUNTER — Ambulatory Visit (INDEPENDENT_AMBULATORY_CARE_PROVIDER_SITE_OTHER): Payer: BC Managed Care – PPO

## 2019-06-27 ENCOUNTER — Other Ambulatory Visit: Payer: Self-pay

## 2019-06-27 ENCOUNTER — Telehealth: Payer: Self-pay | Admitting: Gastroenterology

## 2019-06-27 DIAGNOSIS — Z1159 Encounter for screening for other viral diseases: Secondary | ICD-10-CM

## 2019-06-27 LAB — SARS CORONAVIRUS 2 (TAT 6-24 HRS): SARS Coronavirus 2: NEGATIVE

## 2019-06-27 MED ORDER — SUPREP BOWEL PREP KIT 17.5-3.13-1.6 GM/177ML PO SOLN: ORAL | 0 refills | Status: DC

## 2019-06-27 NOTE — Progress Notes (Signed)
Resent suprep script with universal codes to pharmacy.

## 2019-06-27 NOTE — Telephone Encounter (Signed)
Codes sent to pharmacy brought price to $97.  Called pt and offered her a Sutab sample that she would have to pick up on Monday with new instructions for Tuesday's procedure.  Patient declined and said she will pick up the Suprep and pay $97 because she lives in [place].

## 2019-06-27 NOTE — Telephone Encounter (Signed)
Resent suprep with universal codes to pharmacy.

## 2019-07-01 ENCOUNTER — Other Ambulatory Visit: Payer: Self-pay

## 2019-07-01 ENCOUNTER — Ambulatory Visit (AMBULATORY_SURGERY_CENTER): Payer: BC Managed Care – PPO | Admitting: Gastroenterology

## 2019-07-01 ENCOUNTER — Encounter: Payer: Self-pay | Admitting: Gastroenterology

## 2019-07-01 VITALS — BP 121/79 | HR 89 | Temp 98.4°F | Resp 16 | Ht 60.0 in | Wt 132.0 lb

## 2019-07-01 DIAGNOSIS — K602 Anal fissure, unspecified: Secondary | ICD-10-CM | POA: Diagnosis not present

## 2019-07-01 DIAGNOSIS — K648 Other hemorrhoids: Secondary | ICD-10-CM | POA: Diagnosis not present

## 2019-07-01 DIAGNOSIS — K625 Hemorrhage of anus and rectum: Secondary | ICD-10-CM

## 2019-07-01 DIAGNOSIS — R194 Change in bowel habit: Secondary | ICD-10-CM

## 2019-07-01 DIAGNOSIS — R109 Unspecified abdominal pain: Secondary | ICD-10-CM

## 2019-07-01 MED ORDER — SODIUM CHLORIDE 0.9 % IV SOLN: 500.0000 mL | Freq: Once | INTRAVENOUS | Status: DC

## 2019-07-01 MED ORDER — AMBULATORY NON FORMULARY MEDICATION: 0 refills | Status: DC

## 2019-07-01 NOTE — Progress Notes (Signed)
Pt's states no medical or surgical changes since previsit or office visit.  Temp- June Vitals- Courtney 

## 2019-07-01 NOTE — Patient Instructions (Signed)
Use nitroglycerin ointment, apply pea sized amount per rectum, three times daily to treat fissure.  Continue Miralax daily for constipation and Bentyl as needed for abdominal pain, if that has helped.   YOU HAD AN ENDOSCOPIC PROCEDURE TODAY AT THE [facility]:   Refer to the procedure report that was given to you for any specific questions about what was found during the examination.  If the procedure report does not answer your questions, please call your gastroenterologist to clarify.  If you requested that your care partner not be given the details of your procedure findings, then the procedure report has been included in a sealed envelope for you to review at your convenience later.  YOU SHOULD EXPECT: Some feelings of bloating in the abdomen. Passage of more gas than usual.  Walking can help get rid of the air that was put into your GI tract during the procedure and reduce the bloating. If you had a lower endoscopy (such as a colonoscopy or flexible sigmoidoscopy) you may notice spotting of blood in your stool or on the toilet paper. If you underwent a bowel prep for your procedure, you may not have a normal bowel movement for a few days.  Please Note:  You might notice some irritation and congestion in your nose or some drainage.  This is from the oxygen used during your procedure.  There is no need for concern and it should clear up in a day or so.  SYMPTOMS TO REPORT IMMEDIATELY:   Following lower endoscopy (colonoscopy or flexible sigmoidoscopy):  Excessive amounts of blood in the stool  Significant tenderness or worsening of abdominal pains  Swelling of the abdomen that is new, acute  Fever of 100F or higher   Following upper endoscopy (EGD)  Vomiting of blood or coffee ground material  New chest pain or pain under the shoulder blades  Painful or persistently difficult swallowing  New shortness of breath  Fever of 100F or higher  Black, tarry-looking stools  For  urgent or emergent issues, a gastroenterologist can be reached at any hour by calling [phone].   DIET:  We do recommend a small meal at first, but then you may proceed to your regular diet.  Drink plenty of fluids but you should avoid alcoholic beverages for 24 hours.  ACTIVITY:  You should plan to take it easy for the rest of today and you should NOT DRIVE or use heavy machinery until tomorrow (because of the sedation medicines used during the test).    FOLLOW UP: Our staff will call the number listed on your records 48-72 hours following your procedure to check on you and address any questions or concerns that you may have regarding the information given to you following your procedure. If we do not reach you, we will leave a message.  We will attempt to reach you two times.  During this call, we will ask if you have developed any symptoms of COVID 19. If you develop any symptoms (ie: fever, flu-like symptoms, shortness of breath, cough etc.) before then, please call [phone].  If you test positive for Covid 19 in the 2 weeks post procedure, please call and report this information to [place].    If any biopsies were taken you will be contacted by phone or by letter within the next 1-3 weeks.  Please call us at [phone] if you have not heard about the biopsies in 3 weeks.    SIGNATURES/CONFIDENTIALITY: You and/or your care partner  have signed paperwork which will be entered into your electronic medical record.  These signatures attest to the fact that that the information above on your After Visit Summary has been reviewed and is understood.  Full responsibility of the confidentiality of this discharge information lies with you and/or your care-partner.

## 2019-07-01 NOTE — Op Note (Signed)
[facility] Patient Name: Veronica [name] Procedure Date: 07/01/2019 10:30 AM MRN: [id number] Endoscopist: Viviann Spare P. Adela Lank , MD Age: 44 Referring MD:  Date of Birth: [birth year] Gender: Female Account #: [id number] Procedure:                Colonoscopy Indications:              Abdominal pain, Rectal bleeding, Change in bowel                            habits Medicines:                Monitored Anesthesia Care Procedure:                Pre-Anesthesia Assessment:                           - Prior to the procedure, a History and Physical                            was performed, and patient medications and                            allergies were reviewed. The patient's tolerance of                            previous anesthesia was also reviewed. The risks                            and benefits of the procedure and the sedation                            options and risks were discussed with the patient.                            All questions were answered, and informed consent                            was obtained. Prior Anticoagulants: The patient has                            taken no previous anticoagulant or antiplatelet                            agents. ASA Grade Assessment: II - A patient with                            mild systemic disease. After reviewing the risks                            and benefits, the patient was deemed in                            satisfactory condition to undergo the procedure.  After obtaining informed consent, the colonoscope                            was passed under direct vision. Throughout the                            procedure, the patient's blood pressure, pulse, and                            oxygen saturations were monitored continuously. The                            Colonoscope was introduced through the anus and                            advanced to the the terminal ileum, with                       identification of the appendiceal orifice and IC                            valve. The colonoscopy was performed without                            difficulty. The patient tolerated the procedure                            well. The quality of the bowel preparation was                            adequate. The terminal ileum, ileocecal valve,                            appendiceal orifice, and rectum were photographed. Scope In: 10:48:39 AM Scope Out: 11:03:46 AM Scope Withdrawal Time: 0 hours 13 minutes 0 seconds  Total Procedure Duration: 0 hours 15 minutes 7 seconds  Findings:                 An anal fissure was found on perianal exam.                           The terminal ileum appeared normal.                           Internal hemorrhoids were found during                            retroflexion. The hemorrhoids were small.                           The exam was otherwise without abnormality. Complications:            No immediate complications. Estimated blood loss:                            None. Estimated Blood Loss:  Estimated blood loss: none. Impression:               - Anal fissure found on perianal exam.                           - The examined portion of the ileum was normal.                           - Internal hemorrhoids.                           - The examination was otherwise normal.                           - No polyps.                           Suspect anal fissure / hemorrhoids is the cause of                            bleeding symptoms. Recommendation:           - Patient has a contact number available for                            emergencies. The signs and symptoms of potential                            delayed complications were discussed with the                            patient. Return to normal activities tomorrow.                            Written discharge instructions were provided to the                             patient.                           - Resume previous diet.                           - Continue present medications.                           - Recommend topical 0.125% nitroglycerin ointment,                            apply pea sized amount PR three times daily to                            treat fissure                           - Continue Miralax daily for constipation and  bentyl as needed for abdominal pain, if that has                            helped                           - Repeat colonoscopy in 10 years for screening                            purposes. Viviann Spare P. Clorissa Gruenberg, MD 07/01/2019 11:11:20 AM This report has been signed electronically.

## 2019-07-01 NOTE — Progress Notes (Signed)
Report given to PACU, vss 

## 2019-07-01 NOTE — Op Note (Signed)
[facility] Patient Name: Veronica [name] Procedure Date: 07/01/2019 10:31 AM MRN: [id number] Endoscopist: Viviann Spare P. Adela Lank , MD Age: 44 Referring MD:  Date of Birth: 01-27-1976 Gender: Female Account #: [id number] Procedure:                Upper GI endoscopy Indications:              Abdominal pain, change in bowel habits (passed dark                            stools previously) - trial of omeprazole has helped                            symptoms Medicines:                Monitored Anesthesia Care Procedure:                Pre-Anesthesia Assessment:                           - Prior to the procedure, a History and Physical                            was performed, and patient medications and                            allergies were reviewed. The patient's tolerance of                            previous anesthesia was also reviewed. The risks                            and benefits of the procedure and the sedation                            options and risks were discussed with the patient.                            All questions were answered, and informed consent                            was obtained. Prior Anticoagulants: The patient has                            taken no previous anticoagulant or antiplatelet                            agents. ASA Grade Assessment: II - A patient with                            mild systemic disease. After reviewing the risks                            and benefits, the patient was deemed in  satisfactory condition to undergo the procedure.                           After obtaining informed consent, the endoscope was                            passed under direct vision. Throughout the                            procedure, the patient's blood pressure, pulse, and                            oxygen saturations were monitored continuously. The                            Endoscope was introduced through  the mouth, and                            advanced to the second part of duodenum. The upper                            GI endoscopy was accomplished without difficulty.                            The patient tolerated the procedure well. Scope In: Scope Out: Findings:                 Esophagogastric landmarks were identified: the                            Z-line was found at 36 cm, the gastroesophageal                            junction was found at 36 cm and the upper extent of                            the gastric folds was found at 36 cm from the                            incisors.                           The exam of the esophagus was otherwise normal.                           The entire examined stomach was normal. Biopsies                            were taken with a cold forceps for Helicobacter                            pylori testing.                           The duodenal bulb  and second portion of the                            duodenum were normal. Biopsies for histology were                            taken with a cold forceps for evaluation of celiac                            disease. Complications:            No immediate complications. Estimated blood loss:                            Minimal. Estimated Blood Loss:     Estimated blood loss was minimal. Impression:               - Esophagogastric landmarks identified.                           - Normal esophagus.                           - Normal stomach. Biopsied.                           - Normal duodenal bulb and second portion of the                            duodenum. Biopsied.                           No obvious abnormalities on this exam, but will                            await biopsy results. Recommendation:           - Patient has a contact number available for                            emergencies. The signs and symptoms of potential                            delayed complications were discussed  with the                            patient. Return to normal activities tomorrow.                            Written discharge instructions were provided to the                            patient.                           - Resume previous diet.                           -  Continue present medications including continued                            trial of omeprazole if that has helped.                           - Await pathology results. Viviann Spare P. Swara Donze, MD 07/01/2019 11:15:05 AM This report has been signed electronically.

## 2019-07-03 ENCOUNTER — Other Ambulatory Visit: Payer: Self-pay | Admitting: Gastroenterology

## 2019-07-03 ENCOUNTER — Telehealth: Payer: Self-pay

## 2019-07-03 DIAGNOSIS — R1013 Epigastric pain: Secondary | ICD-10-CM

## 2019-07-03 MED ORDER — SUCRALFATE 1 G PO TABS: 1.0000 g | ORAL_TABLET | Freq: Four times a day (QID) | ORAL | 1 refills | Status: DC | PRN

## 2019-07-03 NOTE — Telephone Encounter (Signed)
First post procedure follow up call, no answer 

## 2019-07-03 NOTE — Telephone Encounter (Signed)
Called patient back. She states since the procedure she has had burning epigastric discomfort and worsening reflux, doesn't really have any lower pain or discomfort, mostly burning in the upper abdomen. She has taken omeprazole 20mg  as just recently given to her, would increase it to twice daily and gave her some carafate to use every 6 hours as well which should help. No high risk interventions done during the exam. She is otherwise comfortable and eating okay. Will await path results from her endoscopy and let her know results and check on her in a few days. If no improvement she will call back

## 2019-07-03 NOTE — Telephone Encounter (Signed)
  Follow up Call-  Call back number 07/01/2019  Post procedure Call Back phone  # [phone]  Permission to leave phone message Yes  Some recent data might be hidden     Patient questions:  Do you have a fever, pain , or abdominal swelling? Yes.   Pain Score  7 *  Have you tolerated food without any problems? No.  Have you been able to return to your normal activities? Yes.    Do you have any questions about your discharge instructions: Diet   No. Medications  No. Follow up visit  No.  Do you have questions or concerns about your Care? No.  Actions: * If pain score is 4 or above: Physician/ provider Notified : Willaim Rayas. Adela Lank, MD   .

## 2019-07-03 NOTE — Telephone Encounter (Signed)
Called patient and no answer, left message, will call back

## 2019-07-08 ENCOUNTER — Telehealth: Payer: Self-pay

## 2019-07-08 NOTE — Telephone Encounter (Signed)
Left message for patient to please call back. 

## 2019-07-08 NOTE — Telephone Encounter (Signed)
-----   Message from Benancio Deeds, MD sent at 07/08/2019  7:34 AM EST ----- Darral Dash can you help relay the following: - EGD was normal. Biopsies taken of the stomach and small bowel are normal. Negative for H pylori and celiac disease - Colonoscopy showed an anal fissure as the cause for her bleeding symptoms - She should be on the following regimen: Omeprazole, carafate PRN, Miralax daily, Bentyl PRN, and topical nitroglycerin for the fissure. Can you see how she is doing on the regimen? Would be good to book her a follow up next month or so for reassessment. Thanks

## 2019-07-16 ENCOUNTER — Other Ambulatory Visit: Payer: Self-pay | Admitting: Internal Medicine

## 2019-07-16 DIAGNOSIS — F419 Anxiety disorder, unspecified: Secondary | ICD-10-CM

## 2019-07-16 MED ORDER — SERTRALINE HCL 100 MG PO TABS: 100.0000 mg | ORAL_TABLET | Freq: Every day | ORAL | 3 refills | Status: DC

## 2019-08-12 ENCOUNTER — Ambulatory Visit: Payer: BC Managed Care – PPO | Admitting: Internal Medicine

## 2019-08-14 ENCOUNTER — Telehealth: Payer: Self-pay | Admitting: Gastroenterology

## 2019-08-14 ENCOUNTER — Ambulatory Visit: Payer: BC Managed Care – PPO | Admitting: Gastroenterology

## 2019-08-14 NOTE — Telephone Encounter (Signed)
Virtual visit scheduled on 3/26 at 1:40pm.

## 2019-08-14 NOTE — Telephone Encounter (Signed)
Hi Jan, pt cancelled her appt for this afternoon. 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 She is requesting a virtual visit before she r/s her appt. Pls advise if this is possible. Thank you.

## 2019-08-14 NOTE — Telephone Encounter (Signed)
Thank you Cathy.  Since we are seeing her for an anal fissure it would probably be best if we see her in person.  If she says she is drastically improved and doesn't feel like he needs to look at the area and/or if she has other symptoms that she would like to discuss over the phone, you could offer her a  Virtual visit.  If so he prefers [employer], so make sure she has a cell phone.  Thank you

## 2019-08-17 ENCOUNTER — Ambulatory Visit: Payer: BC Managed Care – PPO | Attending: Internal Medicine

## 2019-08-17 DIAGNOSIS — Z23 Encounter for immunization: Secondary | ICD-10-CM | POA: Insufficient documentation

## 2019-08-17 NOTE — Progress Notes (Signed)
   Covid-19 Vaccination Clinic  Name:  Veronica [name]    MRN: [id number] DOB: [birth year]  08/17/2019  [name] was observed post Covid-19 immunization for 15 minutes without incidence. She was provided with Vaccine Information Sheet and instruction to access the V-Safe system.   [name] was instructed to call 911 with any severe reactions post vaccine: Marland Kitchen Difficulty breathing  . Swelling of your face and throat  . A fast heartbeat  . A bad rash all over your body  . Dizziness and weakness    Immunizations Administered    Name Date Dose VIS Date Route   Pfizer COVID-19 Vaccine 08/17/2019  8:35 AM 0.3 mL 05/30/2019 Intramuscular   Manufacturer: [employer], [employer]   Lot: [id number]   NDC: [address]-8

## 2019-08-22 ENCOUNTER — Other Ambulatory Visit: Payer: Self-pay

## 2019-08-22 MED ORDER — OMEPRAZOLE 20 MG PO CPDR: 20.0000 mg | DELAYED_RELEASE_CAPSULE | Freq: Every day | ORAL | 3 refills | Status: DC

## 2019-09-08 ENCOUNTER — Ambulatory Visit: Payer: BC Managed Care – PPO | Attending: Internal Medicine

## 2019-09-08 DIAGNOSIS — Z23 Encounter for immunization: Secondary | ICD-10-CM

## 2019-09-08 NOTE — Progress Notes (Signed)
   Covid-19 Vaccination Clinic  Name:  Veronica [name]    MRN: [id number] DOB: Jun 28, 1975  09/08/2019  [name] was observed post Covid-19 immunization for 15 minutes without incident. She was provided with Vaccine Information Sheet and instruction to access the V-Safe system.   [name] was instructed to call 911 with any severe reactions post vaccine: Marland Kitchen Difficulty breathing  . Swelling of face and throat  . A fast heartbeat  . A bad rash all over body  . Dizziness and weakness   Immunizations Administered    Name Date Dose VIS Date Route   Pfizer COVID-19 Vaccine 09/08/2019  9:34 AM 0.3 mL 05/30/2019 Intramuscular   Manufacturer: [employer], [employer]   Lot: [id number]   NDC: [address]-9

## 2019-09-10 ENCOUNTER — Telehealth: Payer: Self-pay

## 2019-09-10 NOTE — Telephone Encounter (Signed)
Called but was unable to leave message for pt as her voice mail is full.  Need to inquire about her appt on Friday for anal fissure. She requested a doximity appt but will only be appropriate if she has experienced marked improvement and examination is not needed.

## 2019-09-10 NOTE — Telephone Encounter (Signed)
-----   Message from Cooper Render, CMA sent at 09/07/2019  8:42 AM EDT ----- Regarding: doximity visit - anal fissure Patient has a virtual appt on Friday 3-26 for anal fissure

## 2019-09-11 NOTE — Telephone Encounter (Signed)
Called but was unable to leave a message as voicemail was full.

## 2019-09-11 NOTE — Telephone Encounter (Signed)
MyChart message sent to patient asking her to call the office to discuss doximity appt on 3-26.

## 2019-09-12 ENCOUNTER — Ambulatory Visit (INDEPENDENT_AMBULATORY_CARE_PROVIDER_SITE_OTHER): Payer: BC Managed Care – PPO | Admitting: Gastroenterology

## 2019-09-12 DIAGNOSIS — K5900 Constipation, unspecified: Secondary | ICD-10-CM

## 2019-09-12 DIAGNOSIS — K219 Gastro-esophageal reflux disease without esophagitis: Secondary | ICD-10-CM | POA: Diagnosis not present

## 2019-09-12 DIAGNOSIS — Z8371 Family history of colonic polyps: Secondary | ICD-10-CM

## 2019-09-12 DIAGNOSIS — K602 Anal fissure, unspecified: Secondary | ICD-10-CM | POA: Diagnosis not present

## 2019-09-12 MED ORDER — DICYCLOMINE HCL 10 MG PO CAPS: 10.0000 mg | ORAL_CAPSULE | Freq: Three times a day (TID) | ORAL | 3 refills | Status: DC | PRN

## 2019-09-12 MED ORDER — OMEPRAZOLE 20 MG PO CPDR: 20.0000 mg | DELAYED_RELEASE_CAPSULE | Freq: Two times a day (BID) | ORAL | 3 refills | Status: DC

## 2019-09-12 NOTE — Progress Notes (Signed)
THIS ENCOUNTER IS A VIRTUAL VISIT DUE TO COVID-19 - PATIENT WAS NOT SEEN IN THE OFFICE. PATIENT HAS CONSENTED TO VIRTUAL VISIT / TELEMEDICINE VISIT USING DOXIMITY APP   Location of patient: work Location of provider: office Persons participating: myself, patient Time spent on call: 25 minutes spent reviewing chart, time with patient, documenting the encounter   HPI :  44 y/o female here for a follow-up visit regarding GERD, constipation, bloating, history of rectal bleeding due to fissure.  Since her last visit she had an endoscopy and colonoscopy as outlined below:  EGD 07/01/19 -  - The exam of the esophagus was otherwise normal. - The entire examined stomach was normal. Biopsies were taken with a cold forceps for Helicobacter pylori testing. - The duodenal bulb and second portion of the duodenum were normal. Biopsies for histology were taken with a cold forceps for evaluation of celiac disease.  Colonoscopy 07/01/19 - An anal fissure was found on perianal exam. - The terminal ileum appeared normal. - Internal hemorrhoids were found during retroflexion. The hemorrhoids were small. - The exam was otherwise without abnormality.  Diagnosis 1. Surgical [P], duodenal - DUODENAL MUCOSA WITH NO SPECIFIC HISTOPATHOLOGIC CHANGES - NEGATIVE FOR INCREASED INTRAEPITHELIAL LYMPHOCYTES OR VILLOUS ARCHITECTURAL CHANGES 2. Surgical [P], gastric antrum and gastric body - GASTRIC ANTRAL AND OXYNTIC MUCOSA WITH NO SPECIFIC HISTOPATHOLOGIC CHANGES - ISOLATED FRAGMENT OF UNREMARKABLE DUODENAL MUCOSA - WARTHIN STARRY STAIN IS NEGATIVE FOR HELICOBACTER PYLORI  For her constipation and bloating we discussed using MiraLAX every day and titrate as needed to effect.  She has been using it once a day and has been happy with this.  When she has home she drinks more water than at work, so at home once daily dosing works much better for her.  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  She has been using Bentyl as needed for cramps and bloating this is definitely helped her since the last visit.  She is happy with this regimen.  We treated the anal fissure with topical nitroglycerin ointment and that has since healed as well.  She has no further rectal pain or bleeding.  In regards to her reflux, she has been on omeprazole 20 mg twice daily and this has been working much better for her lately.  Reflux seems to be well controlled.  Periodically has some nocturnal symptoms but generally happy on the regimen.  She had no Barrett's esophagus or large hiatal hernia on her EGD.  Her grandmother had colon cancer in her 60s, her father had multiple polyps removed.  She did not have any polyps removed during her last colonoscopy and discuss timing of her next surveillance.  She is fairly certain her father has had numerous precancerous polyps removed at a younger age and feels more comfortable keeping a surveillance schedule every 5 years.   Labs earlier in December showed no anemia, normal white blood cell count, normal LFTs.  CT scan abdomen / pelvis with contrast 05/03/2018 - IMPRESSION: 1. Complex loculated fluid in the cul-de-sac region. Question hemorrhagic fluid from recent ovarian cyst rupture. Infected fluid could present similarly. No pelvic mass evident.  2. Stool throughout colon. Question a degree of constipation. No evident bowel obstruction. No abscess in the abdomen pelvis. Appendix appears normal with the exception of a small appendicolith within the appendix. No appendiceal  region inflammation evident.  3. No evident renal or ureteral calculus. No hydronephrosis.  3. 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 Clinical significance of this finding uncertain.  4. Mild aortic atherosclerosis. No  5. Intrauterine device positioned in the  endometrium    Past Medical History:  Diagnosis Date  . Allergy    cedar, tree, dogs, cats   . Anxiety 06/06/2016  . Asthma    allergic   . Depression   . GERD (gastroesophageal reflux disease)   . Hyperlipidemia   . Hypoglycemia      Past Surgical History:  Procedure Laterality Date  . DILATION AND CURETTAGE OF UTERUS    . vocal cord surgery     Family History  Problem Relation Age of Onset  . Hyperlipidemia Mother   . Heart disease Father        heart valve issues s/p heart surgery   . Hypertension Father   . Diabetes Father   . Colon polyps Father        precancerous  . Stroke Father   . Arthritis Maternal Grandmother   . Stroke Maternal Grandmother   . Hyperlipidemia Maternal Grandmother   . Hypertension Maternal Grandfather   . Colon cancer Paternal Grandmother   . Hypertension Paternal Grandfather   . Diabetes Paternal Grandfather   . Endometriosis Sister   . Hypertension Brother   . Diabetes Brother   . Colon polyps Paternal Aunt   . Breast cancer Neg Hx    Social History   Tobacco Use  . Smoking status: Never Smoker  . Smokeless tobacco: Never Used  Substance Use Topics  . Alcohol use: Yes    Alcohol/week: 2.0 standard drinks    Types: 2 Standard drinks or equivalent per week    Comment: social  . Drug use: No   Current Outpatient Medications  Medication Sig Dispense Refill  . albuterol (PROVENTIL HFA;VENTOLIN HFA) 108 (90 Base) MCG/ACT inhaler Inhale 1-2 puffs into the lungs every 6 (six) hours as needed for wheezing or shortness of breath. 1 Inhaler 12  . AMBULATORY NON FORMULARY MEDICATION Medication Name: 0.125% Nitroglycerin ointment, apply pea sized amount per rectum, three times daily to treat fissure. 30 g 0  . buPROPion (WELLBUTRIN XL) 150 MG 24 hr tablet Take 1 tablet (150 mg total) by mouth daily. 90 tablet 3  . cetirizine (ZYRTEC) 10 MG tablet Take 1 tablet (10 mg total) by mouth daily. 90 tablet 3  . Cholecalciferol (VITAMIN D-3)  125 MCG (5000 UT) TABS Take 1 tablet by mouth daily.    Marland Kitchen dicyclomine (BENTYL) 10 MG capsule Take 1-2 capsules (10-20 mg total) by mouth every 8 (eight) hours as needed for spasms. 30 capsule 1  . levonorgestrel (MIRENA) 20 MCG/24HR IUD 1 each by Intrauterine route once.    Marland Kitchen LORazepam (ATIVAN) 0.5 MG tablet 1/2 pill at night as needed prn anxiety 20 tablet 0  . montelukast (SINGULAIR) 10 MG tablet Take 1 tablet (10 mg total) by mouth at bedtime. 90 tablet 2  . omeprazole (PRILOSEC) 20 MG capsule Take 1 capsule (20 mg total) by mouth daily. You have an appt with Dr. Adela Lank on 09-12-19. 30 capsule 3  . polyethylene glycol (MIRALAX) 17 g packet Take 17 g by mouth daily. Titrate up as needed 14 each 0  . Probiotic Product (PROBIOTIC PO) Take 1 tablet by mouth daily.    . sertraline (ZOLOFT) 100 MG tablet Take 1 tablet (100 mg total) by mouth daily. 90 tablet  3  . sucralfate (CARAFATE) 1 g tablet Take 1 tablet (1 g total) by mouth every 6 (six) hours as needed. 60 tablet 1   No current facility-administered medications for this visit.   Allergies  Allergen Reactions  . Doxycycline Nausea And Vomiting  . Pseudoephedrine Hcl Palpitations  . Sulfa Antibiotics Rash     Review of Systems: All systems reviewed and negative except where noted in HPI.    No results found.  Physical Exam: There were no vitals taken for this visit. Constitutional: Pleasant, female in no acute distress.  ASSESSMENT AND PLAN: 44 year old female here for reassessment of the following:  Constipation / anal fissure - doing better with MiraLAX daily although having a hard time drinking enough water during the daytime at her job.  She can increase the dose to twice daily if needed.  Bentyl working quite well for her, I suspect she likely has IBS C.  We will refill Bentyl and she can use this as needed.  Otherwise the anal fissure healed with topical nitroglycerin and she can use that as needed moving forward.   Reassured her no concerning findings on colonoscopy otherwise.  She can follow-up as needed for this issue  GERD - doing better on omeprazole 20 mg twice daily and she wishes to continue this dose.  We discussed long-term PPI use and long-term recommend lowest dose of PPI needed to control symptoms, however appears that twice daily dose works much better for her to control symptoms.  She can follow-up as needed for this issue.  Family history of colon polyps - father had numerous reported precancerous polyps at a younger age, she is not comfortable waiting 10 years until her next colonoscopy and would prefer to keep surveillance intervals at 5 years.  Doubt it is possible to get records from her father's prior colonoscopies to clarify findings, we will set her next recall for 5 years from her last exam and discuss further surveillance pending that result.  Rosemont Cellar, MD [facility]

## 2019-09-12 NOTE — Patient Instructions (Addendum)
If you are age 43 or older, your body mass index should be between 23-30. Your There is no height or weight on file to calculate BMI. If this is out of the aforementioned range listed, please consider follow up with your Primary Care Provider.  If you are age 1 or younger, your body mass index should be between 19-25. Your There is no height or weight on file to calculate BMI. If this is out of the aformentioned range listed, please consider follow up with your Primary Care Provider.    We have sent the following medications to your pharmacy for you to pick up at your convenience: Bentyl 10mg : Take every 8 hours as needed Omeprazole 20mg : Take twice daily   We will place a recall for a colonoscopy in 5 years for 06-2024.   Thank you for entrusting me with your care and for choosing [facility], Dr. 06-28-1995

## 2019-09-18 ENCOUNTER — Ambulatory Visit: Payer: BC Managed Care – PPO | Admitting: Internal Medicine

## 2019-11-25 ENCOUNTER — Encounter: Payer: Self-pay | Admitting: Internal Medicine

## 2019-11-25 ENCOUNTER — Other Ambulatory Visit: Payer: Self-pay

## 2019-11-25 ENCOUNTER — Ambulatory Visit (INDEPENDENT_AMBULATORY_CARE_PROVIDER_SITE_OTHER): Payer: BC Managed Care – PPO | Admitting: Internal Medicine

## 2019-11-25 VITALS — BP 114/76 | HR 93 | Temp 97.9°F | Ht 60.0 in | Wt 127.8 lb

## 2019-11-25 DIAGNOSIS — Z0000 Encounter for general adult medical examination without abnormal findings: Secondary | ICD-10-CM | POA: Diagnosis not present

## 2019-11-25 DIAGNOSIS — Z1329 Encounter for screening for other suspected endocrine disorder: Secondary | ICD-10-CM

## 2019-11-25 DIAGNOSIS — K219 Gastro-esophageal reflux disease without esophagitis: Secondary | ICD-10-CM | POA: Diagnosis not present

## 2019-11-25 DIAGNOSIS — F419 Anxiety disorder, unspecified: Secondary | ICD-10-CM

## 2019-11-25 DIAGNOSIS — E785 Hyperlipidemia, unspecified: Secondary | ICD-10-CM

## 2019-11-25 DIAGNOSIS — G4700 Insomnia, unspecified: Secondary | ICD-10-CM

## 2019-11-25 DIAGNOSIS — R109 Unspecified abdominal pain: Secondary | ICD-10-CM | POA: Diagnosis not present

## 2019-11-25 DIAGNOSIS — N951 Menopausal and female climacteric states: Secondary | ICD-10-CM

## 2019-11-25 DIAGNOSIS — F410 Panic disorder [episodic paroxysmal anxiety] without agoraphobia: Secondary | ICD-10-CM

## 2019-11-25 DIAGNOSIS — F32A Depression, unspecified: Secondary | ICD-10-CM

## 2019-11-25 DIAGNOSIS — F339 Major depressive disorder, recurrent, unspecified: Secondary | ICD-10-CM | POA: Insufficient documentation

## 2019-11-25 DIAGNOSIS — Z1159 Encounter for screening for other viral diseases: Secondary | ICD-10-CM

## 2019-11-25 DIAGNOSIS — Z1231 Encounter for screening mammogram for malignant neoplasm of breast: Secondary | ICD-10-CM

## 2019-11-25 DIAGNOSIS — J309 Allergic rhinitis, unspecified: Secondary | ICD-10-CM

## 2019-11-25 DIAGNOSIS — Z0184 Encounter for antibody response examination: Secondary | ICD-10-CM

## 2019-11-25 DIAGNOSIS — K589 Irritable bowel syndrome without diarrhea: Secondary | ICD-10-CM | POA: Insufficient documentation

## 2019-11-25 DIAGNOSIS — F329 Major depressive disorder, single episode, unspecified: Secondary | ICD-10-CM

## 2019-11-25 DIAGNOSIS — J45909 Unspecified asthma, uncomplicated: Secondary | ICD-10-CM

## 2019-11-25 DIAGNOSIS — Z1389 Encounter for screening for other disorder: Secondary | ICD-10-CM

## 2019-11-25 LAB — FOLLICLE STIMULATING HORMONE: FSH: 3.4 m[IU]/mL

## 2019-11-25 MED ORDER — CETIRIZINE HCL 10 MG PO TABS: 10.0000 mg | ORAL_TABLET | Freq: Every day | ORAL | 3 refills | Status: DC

## 2019-11-25 MED ORDER — LORAZEPAM 0.5 MG PO TABS: ORAL_TABLET | ORAL | 0 refills | Status: DC

## 2019-11-25 MED ORDER — ALBUTEROL SULFATE HFA 108 (90 BASE) MCG/ACT IN AERS: 1.0000 | INHALATION_SPRAY | Freq: Four times a day (QID) | RESPIRATORY_TRACT | 11 refills | Status: DC | PRN

## 2019-11-25 MED ORDER — OMEPRAZOLE 20 MG PO CPDR: 20.0000 mg | DELAYED_RELEASE_CAPSULE | Freq: Two times a day (BID) | ORAL | 3 refills | Status: DC

## 2019-11-25 MED ORDER — FLUTICASONE PROPIONATE 50 MCG/ACT NA SUSP: 2.0000 | Freq: Every day | NASAL | 11 refills | Status: DC | PRN

## 2019-11-25 MED ORDER — MONTELUKAST SODIUM 10 MG PO TABS: 10.0000 mg | ORAL_TABLET | Freq: Every day | ORAL | 3 refills | Status: DC

## 2019-11-25 MED ORDER — DICYCLOMINE HCL 10 MG PO CAPS: 10.0000 mg | ORAL_CAPSULE | Freq: Three times a day (TID) | ORAL | 3 refills | Status: DC

## 2019-11-25 NOTE — Progress Notes (Signed)
Chief Complaint  Patient presents with  . Follow-up    would like a refill of Flonase and her inhaler  . Trouble staying asleep   Annual  1. Anxiety improved GAD 7 not done subjective depression also improved PHQ 9 score 4 on wellbutrin xl 150 mg qd, ativan 0.5 qd prn zoloft 100 mg qd  Having trouble staying aleep at night and wakes up multiple times at night  2. Ab spasms and upper ab pain improved on bentyl 10 mg bid prn and prilosec 20 mg bid had EGD/colonoscopy with Dr. Havery Moros 3. HLD rec healthy diet and exercise  4. Allergies was rec to get allergy shots wants to be referred to allergy for shots in the fall, asthma controlled but needs refill of albuterol inhaler   Review of Systems  Constitutional: Negative for weight loss.  HENT: Negative for hearing loss.   Eyes: Negative for blurred vision.  Respiratory: Negative for shortness of breath.   Cardiovascular: Negative for chest pain.  Gastrointestinal: Negative for abdominal pain.  Musculoskeletal: Negative for falls.  Skin: Negative for rash.  Neurological: Negative for headaches.  Psychiatric/Behavioral: Negative for depression.   Past Medical History:  Diagnosis Date  . Allergy    cedar, tree, dogs, cats   . Anxiety 06/06/2016  . Asthma    allergic   . Depression   . GERD (gastroesophageal reflux disease)   . Hyperlipidemia   . Hypoglycemia    Past Surgical History:  Procedure Laterality Date  . DILATION AND CURETTAGE OF UTERUS    . vocal cord surgery     Family History  Problem Relation Age of Onset  . Hyperlipidemia Mother   . Heart disease Father        heart valve issues s/p heart surgery   . Hypertension Father   . Diabetes Father   . Colon polyps Father        precancerous  . Stroke Father   . Arthritis Maternal Grandmother   . Stroke Maternal Grandmother   . Hyperlipidemia Maternal Grandmother   . Hypertension Maternal Grandfather   . Colon cancer Paternal Grandmother        dx age 89 y.o    . Hypertension Paternal Grandfather   . Diabetes Paternal Grandfather   . Endometriosis Sister   . Hypertension Brother   . Diabetes Brother   . Colon polyps Paternal Aunt   . Breast cancer Neg Hx    Social History   Socioeconomic History  . Marital status: Divorced    Spouse name: Not on file  . Number of children: 2  . Years of education: Not on file  . Highest education level: Not on file  Occupational History  . Occupation: Pharmacist, hospital  Tobacco Use  . Smoking status: Never Smoker  . Smokeless tobacco: Never Used  Substance and Sexual Activity  . Alcohol use: Yes    Alcohol/week: 2.0 standard drinks    Types: 2 Standard drinks or equivalent per week    Comment: social  . Drug use: No  . Sexual activity: Yes    Partners: Male  Other Topics Concern  . Not on file  Social History Narrative   Married ? 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    Social Determinants of Health   Financial Resource Strain:   . Difficulty of Paying Living Expenses:   Food Insecurity:   .  Worried About Charity fundraiser in the Last Year:   . Arboriculturist in the Last Year:   Transportation Needs:   . Film/video editor (Medical):   Marland Kitchen Lack of Transportation (Non-Medical):   Physical Activity:   . Days of Exercise per Week:   . Minutes of Exercise per Session:   Stress:   . Feeling of Stress :   Social Connections:   . Frequency of Communication with Friends and Family:   . Frequency of Social Gatherings with Friends and Family:   . Attends Religious Services:   . Active Member of Clubs or Organizations:   . Attends Archivist Meetings:   Marland Kitchen Marital Status:   Intimate Partner Violence:   . Fear of Current or Ex-Partner:   . Emotionally Abused:   Marland Kitchen Physically Abused:   . Sexually Abused:    Current Meds  Medication Sig  . albuterol (VENTOLIN HFA) 108 (90 Base) MCG/ACT inhaler Inhale 1-2 puffs into the  lungs every 6 (six) hours as needed for wheezing or shortness of breath.  . AMBULATORY NON FORMULARY MEDICATION Medication Name: 0.125% Nitroglycerin ointment, apply pea sized amount per rectum, three times daily to treat fissure.  Marland Kitchen buPROPion (WELLBUTRIN XL) 150 MG 24 hr tablet Take 1 tablet (150 mg total) by mouth daily.  . cetirizine (ZYRTEC) 10 MG tablet Take 1 tablet (10 mg total) by mouth daily.  . Cholecalciferol (VITAMIN D-3) 125 MCG (5000 UT) TABS Take 1 tablet by mouth daily.  Marland Kitchen dicyclomine (BENTYL) 10 MG capsule Take 1 capsule (10 mg total) by mouth 3 (three) times daily before meals.  Marland Kitchen levonorgestrel (MIRENA) 20 MCG/24HR IUD 1 each by Intrauterine route once.  Marland Kitchen LORazepam (ATIVAN) 0.5 MG tablet 1/2 pill at night as needed prn anxiety  . montelukast (SINGULAIR) 10 MG tablet Take 1 tablet (10 mg total) by mouth at bedtime.  Marland Kitchen omeprazole (PRILOSEC) 20 MG capsule Take 1 capsule (20 mg total) by mouth 2 (two) times daily before a meal.  . polyethylene glycol (MIRALAX) 17 g packet Take 17 g by mouth daily. Titrate up as needed  . sertraline (ZOLOFT) 100 MG tablet Take 1 tablet (100 mg total) by mouth daily.  . [DISCONTINUED] albuterol (PROVENTIL HFA;VENTOLIN HFA) 108 (90 Base) MCG/ACT inhaler Inhale 1-2 puffs into the lungs every 6 (six) hours as needed for wheezing or shortness of breath.  . [DISCONTINUED] cetirizine (ZYRTEC) 10 MG tablet Take 1 tablet (10 mg total) by mouth daily.  . [DISCONTINUED] dicyclomine (BENTYL) 10 MG capsule Take 1-2 capsules (10-20 mg total) by mouth every 8 (eight) hours as needed for spasms.  . [DISCONTINUED] LORazepam (ATIVAN) 0.5 MG tablet 1/2 pill at night as needed prn anxiety  . [DISCONTINUED] montelukast (SINGULAIR) 10 MG tablet Take 1 tablet (10 mg total) by mouth at bedtime.  . [DISCONTINUED] omeprazole (PRILOSEC) 20 MG capsule Take 1 capsule (20 mg total) by mouth 2 (two) times daily before a meal.   Allergies  Allergen Reactions  . Doxycycline  Nausea And Vomiting  . Pseudoephedrine Hcl Palpitations  . Sulfa Antibiotics Rash   No results found for this or any previous visit (from the past 2160 hour(s)). Objective  Body mass index is 24.96 kg/m. Wt Readings from Last 3 Encounters:  11/25/19 127 lb 12.8 oz (58 kg)  07/01/19 132 lb (59.9 kg)  06/23/19 132 lb (59.9 kg)   Temp Readings from Last 3 Encounters:  11/25/19 97.9 F (36.6 C)  07/01/19  98.4 F (36.9 C)  06/23/19 98 F (36.7 C)   BP Readings from Last 3 Encounters:  11/25/19 114/76  07/01/19 121/79  06/23/19 120/74   Pulse Readings from Last 3 Encounters:  11/25/19 93  07/01/19 89  06/23/19 88    Physical Exam Vitals and nursing note reviewed.  Constitutional:      Appearance: Normal appearance. She is well-developed and well-groomed.  HENT:     Head: Normocephalic and atraumatic.  Eyes:     Conjunctiva/sclera: Conjunctivae normal.     Pupils: Pupils are equal, round, and reactive to light.  Cardiovascular:     Rate and Rhythm: Normal rate and regular rhythm.     Heart sounds: Normal heart sounds. No murmur.  Pulmonary:     Effort: Pulmonary effort is normal.     Breath sounds: Normal breath sounds.  Skin:    General: Skin is warm and dry.  Neurological:     General: No focal deficit present.     Mental Status: She is alert and oriented to person, place, and time. Mental status is at baseline.     Gait: Gait normal.  Psychiatric:        Attention and Perception: Attention and perception normal.        Mood and Affect: Mood and affect normal.        Speech: Speech normal.        Behavior: Behavior normal. Behavior is cooperative.        Thought Content: Thought content normal.        Cognition and Memory: Cognition and memory normal.        Judgment: Judgment normal.     Assessment  Plan  Annual physical exam HM Labs due 05/19/2020 or after will call to schedule Had flu shot 2018rec this year will need Tdap had 05/13/14 covid  2/2 3/3 hep B vaccinescheck titer  MMR immune  Declines STD cehck  10/30/18 mammo cyst left breast and otherwise neg  -ordered call to schedule   Get records pap [facility] Dr. Lurline Idol will call and sch  Referredderm in pastpt saw and had check up 2019 vs 2020  -->no issues as of 11/25/19 no need for referral  Colonoscopy had 07/01/19 [facility] rec[facility] dad with precancerous polpys and grandparent colon cancer   rec healthy diet and exercise   Abdominal spasms - Plan: dicyclomine (BENTYL) 10 MG capsule, omeprazole (PRILOSEC) 20 MG capsule  Gastroesophageal reflux disease without esophagitis - Plan: omeprazole (PRILOSEC) 20 MG capsule  Perimenopause - Plan: [facility] F/u ob/gyn  Mom had early menopause in 62s   Extrinsic asthma without complication, unspecified asthma severity, unspecified whether persistent - Plan: albuterol (VENTOLIN HFA) 108 (90 Base) MCG/ACT inhaler  Anxiety - Plan: LORazepam (ATIVAN) 0.5 MG tablet Depression, recurrent improved- Plan: LORazepam (ATIVAN) 0.5 MG tablet Panic attack - Plan: LORazepam (ATIVAN) 0.5 MG tablet Insomnia Prn ativan consider restoril in the future  Allergic rhinitis, unspecified seasonality, unspecified trigger - Plan: cetirizine (ZYRTEC) 10 MG tablet, montelukast (SINGULAIR) 10 MG tablet, fluticasone (FLONASE) 50 MCG/ACT nasal spray Call back wen ready for allergy referral for shots  Hyperlipidemia, unspecified hyperlipidemia type  Fasting labs 05/19/20   Provider: Dr. Olivia Mackie McLean-Scocuzza-Internal Medicine

## 2019-11-25 NOTE — Patient Instructions (Addendum)
Stress Relax brand Tranquil Sleep (melatonin/L theanine/serotonin 5HT)  Or L-Theanine version  Aromatherapy  Insight Timer app   Call back when ready for allergy shot referral [place] [facility] Dr. Melba Coon and schedule pap smear  Mammogram call to schedule  Will check [facility] lab today   Menopause Menopause is the normal time of life when menstrual periods stop completely. It is usually confirmed by 12 months without a menstrual period. The transition to menopause (perimenopause) most often happens between the ages of 23 and 54. During perimenopause, hormone levels change in your body, which can cause symptoms and affect your health. Menopause may increase your risk for:  Loss of bone (osteoporosis), which causes bone breaks (fractures).  Depression.  Hardening and narrowing of the arteries (atherosclerosis), which can cause heart attacks and strokes. What are the causes? This condition is usually caused by a natural change in hormone levels that happens as you get older. The condition may also be caused by surgery to remove both ovaries (bilateral oophorectomy). What increases the risk? This condition is more likely to start at an earlier age if you have certain medical conditions or treatments, including:  A tumor of the pituitary gland in the brain.  A disease that affects the ovaries and hormone production.  Radiation treatment for cancer.  Certain cancer treatments, such as chemotherapy or hormone (anti-estrogen) therapy.  Heavy smoking and excessive alcohol use.  Family history of early menopause. This condition is also more likely to develop earlier in women who are very thin. What are the signs or symptoms? Symptoms of this condition include:  Hot flashes.  Irregular menstrual periods.  Night sweats.  Changes in feelings about sex. This could be a decrease in sex drive or an increased comfort around your sexuality.  Vaginal dryness and thinning of  the vaginal walls. This may cause painful intercourse.  Dryness of the skin and development of wrinkles.  Headaches.  Problems sleeping (insomnia).  Mood swings or irritability.  Memory problems.  Weight gain.  Hair growth on the face and chest.  Bladder infections or problems with urinating. How is this diagnosed? This condition is diagnosed based on your medical history, a physical exam, your age, your menstrual history, and your symptoms. Hormone tests may also be done. How is this treated? In some cases, no treatment is needed. You and your health care provider should make a decision together about whether treatment is necessary. Treatment will be based on your individual condition and preferences. Treatment for this condition focuses on managing symptoms. Treatment may include:  Menopausal hormone therapy (MHT).  Medicines to treat specific symptoms or complications.  Acupuncture.  Vitamin or herbal supplements. Before starting treatment, make sure to let your health care provider know if you have a personal or family history of:  Heart disease.  Breast cancer.  Blood clots.  Diabetes.  Osteoporosis. Follow these instructions at home: Lifestyle  Do not use any products that contain nicotine or tobacco, such as cigarettes and e-cigarettes. If you need help quitting, ask your health care provider.  Get at least 30 minutes of physical activity on 5 or more days each week.  Avoid alcoholic and caffeinated beverages, as well as spicy foods. This may help prevent hot flashes.  Get 7-8 hours of sleep each night.  If you have hot flashes, try: ? Dressing in layers. ? Avoiding things that may trigger hot flashes, such as spicy food, warm places, or stress. ? Taking slow,  deep breaths when a hot flash starts. ? Keeping a fan in your home and office.  Find ways to manage stress, such as deep breathing, meditation, or journaling.  Consider going to group therapy  with other women who are having menopause symptoms. Ask your health care provider about recommended group therapy meetings. Eating and drinking  Eat a healthy, balanced diet that contains whole grains, lean protein, low-fat dairy, and plenty of fruits and vegetables.  Your health care provider may recommend adding more soy to your diet. Foods that contain soy include tofu, tempeh, and soy milk.  Eat plenty of foods that contain calcium and vitamin D for bone health. Items that are rich in calcium include low-fat milk, yogurt, beans, almonds, sardines, broccoli, and kale. Medicines  Take over-the-counter and prescription medicines only as told by your health care provider.  Talk with your health care provider before starting any herbal supplements. If prescribed, take vitamins and supplements as told by your health care provider. These may include: ? Calcium. Women age 77 and older should get 1,200 mg (milligrams) of calcium every day. ? Vitamin D. Women need 600-800 International Units of vitamin D each day. ? Vitamins B12 and B6. Aim for 50 micrograms of B12 and 1.5 mg of B6 each day. General instructions  Keep track of your menstrual periods, including: ? When they occur. ? How heavy they are and how long they last. ? How much time passes between periods.  Keep track of your symptoms, noting when they start, how often you have them, and how long they last.  Use vaginal lubricants or moisturizers to help with vaginal dryness and improve comfort during sex.  Keep all follow-up visits as told by your health care provider. This is important. This includes any group therapy or counseling. Contact a health care provider if:  You are still having menstrual periods after age 11.  You have pain during sex.  You have not had a period for 12 months and you develop vaginal bleeding. Get help right away if:  You have: ? Severe depression. ? Excessive vaginal bleeding. ? Pain when you  urinate. ? A fast or irregular heart beat (palpitations). ? Severe headaches. ? Abdomen (abdominal) pain or severe indigestion.  You fell and you think you have a broken bone.  You develop leg or chest pain.  You develop vision problems.  You feel a lump in your breast. Summary  Menopause is the normal time of life when menstrual periods stop completely. It is usually confirmed by 12 months without a menstrual period.  The transition to menopause (perimenopause) most often happens between the ages of 88 and 31.  Symptoms can be managed through medicines, lifestyle changes, and complementary therapies such as acupuncture.  Eat a balanced diet that is rich in nutrients to promote bone health and heart health and to manage symptoms during menopause. This information is not intended to replace advice given to you by your health care provider. Make sure you discuss any questions you have with your health care provider. Document Revised: 05/18/2017 Document Reviewed: 07/08/2016 Elsevier Patient Education  2020 Else[employer] Inc[employer]    Insomnia Insomnia is a sleep disorder that makes it difficult to fall asleep or stay asleep. Insomnia can cause fatigue, low energy, difficulty concentrating, mood swings, and poor performance at work or school. There are three different ways to classify insomnia:  Difficulty falling asleep.  Difficulty staying asleep.  Waking up too early in the morning. Any type of  insomnia can be long-term (chronic) or short-term (acute). Both are common. Short-term insomnia usually lasts for three months or less. Chronic insomnia occurs at least three times a week for longer than three months. What are the causes? Insomnia may be caused by another condition, situation, or substance, such as:  Anxiety.  Certain medicines.  Gastroesophageal reflux disease (GERD) or other gastrointestinal conditions.  Asthma or other breathing conditions.  Restless legs syndrome,  sleep apnea, or other sleep disorders.  Chronic pain.  Menopause.  Stroke.  Abuse of alcohol, tobacco, or illegal drugs.  Mental health conditions, such as depression.  Caffeine.  Neurological disorders, such as Alzheimer's disease.  An overactive thyroid (hyperthyroidism). Sometimes, the cause of insomnia may not be known. What increases the risk? Risk factors for insomnia include:  Gender. Women are affected more often than men.  Age. Insomnia is more common as you get older.  Stress.  Lack of exercise.  Irregular work schedule or working night shifts.  Traveling between different time zones.  Certain medical and mental health conditions. What are the signs or symptoms? If you have insomnia, the main symptom is having trouble falling asleep or having trouble staying asleep. This may lead to other symptoms, such as:  Feeling fatigued or having low energy.  Feeling nervous about going to sleep.  Not feeling rested in the morning.  Having trouble concentrating.  Feeling irritable, anxious, or depressed. How is this diagnosed? This condition may be diagnosed based on:  Your symptoms and medical history. Your health care provider may ask about: ? Your sleep habits. ? Any medical conditions you have. ? Your mental health.  A physical exam. How is this treated? Treatment for insomnia depends on the cause. Treatment may focus on treating an underlying condition that is causing insomnia. Treatment may also include:  Medicines to help you sleep.  Counseling or therapy.  Lifestyle adjustments to help you sleep better. Follow these instructions at home: Eating and drinking   Limit or avoid alcohol, caffeinated beverages, and cigarettes, especially close to bedtime. These can disrupt your sleep.  Do not eat a large meal or eat spicy foods right before bedtime. This can lead to digestive discomfort that can make it hard for you to sleep. Sleep  habits   Keep a sleep diary to help you and your health care provider figure out what could be causing your insomnia. Write down: ? When you sleep. ? When you wake up during the night. ? How well you sleep. ? How rested you feel the next day. ? Any side effects of medicines you are taking. ? What you eat and drink.  Make your bedroom a dark, comfortable place where it is easy to fall asleep. ? Put up shades or blackout curtains to block light from outside. ? Use a white noise machine to block noise. ? Keep the temperature cool.  Limit screen use before bedtime. This includes: ? Watching TV. ? Using your smartphone, tablet, or computer.  Stick to a routine that includes going to bed and waking up at the same times every day and night. This can help you fall asleep faster. Consider making a quiet activity, such as reading, part of your nighttime routine.  Try to avoid taking naps during the day so that you sleep better at night.  Get out of bed if you are still awake after 15 minutes of trying to sleep. Keep the lights down, but try reading or doing a quiet activity. When you  feel sleepy, go back to bed. General instructions  Take over-the-counter and prescription medicines only as told by your health care provider.  Exercise regularly, as told by your health care provider. Avoid exercise starting several hours before bedtime.  Use relaxation techniques to manage stress. Ask your health care provider to suggest some techniques that may work well for you. These may include: ? Breathing exercises. ? Routines to release muscle tension. ? Visualizing peaceful scenes.  Make sure that you drive carefully. Avoid driving if you feel very sleepy.  Keep all follow-up visits as told by your health care provider. This is important. Contact a health care provider if:  You are tired throughout the day.  You have trouble in your daily routine due to sleepiness.  You continue to have sleep  problems, or your sleep problems get worse. Get help right away if:  You have serious thoughts about hurting yourself or someone else. If you ever feel like you may hurt yourself or others, or have thoughts about taking your own life, get help right away. You can go to your nearest emergency department or call:  Your local emergency services (911 in the [place]  A suicide crisis helpline, such as the National Suicide Prevention Lifeline at 660-044-7590. This is open 24 hours a day. Summary  Insomnia is a sleep disorder that makes it difficult to fall asleep or stay asleep.  Insomnia can be long-term (chronic) or short-term (acute).  Treatment for insomnia depends on the cause. Treatment may focus on treating an underlying condition that is causing insomnia.  Keep a sleep diary to help you and your health care provider figure out what could be causing your insomnia. This information is not intended to replace advice given to you by your health care provider. Make sure you discuss any questions you have with your health care provider. Document Revised: 05/18/2017 Document Reviewed: 03/15/2017 Elsevier Patient Education  2020 Elsevier Inc.    High Cholesterol  High cholesterol is a condition in which the blood has high levels of a white, waxy, fat-like substance (cholesterol). The human body needs small amounts of cholesterol. The liver makes all the cholesterol that the body needs. Extra (excess) cholesterol comes from the food that we eat. Cholesterol is carried from the liver by the blood through the blood vessels. If you have high cholesterol, deposits (plaques) may build up on the walls of your blood vessels (arteries). Plaques make the arteries narrower and stiffer. Cholesterol plaques increase your risk for heart attack and stroke. Work with your health care provider to keep your cholesterol levels in a healthy range. What increases the risk? This condition is more likely to  develop in people who:  Eat foods that are high in animal fat (saturated fat) or cholesterol.  Are overweight.  Are not getting enough exercise.  Have a family history of high cholesterol. What are the signs or symptoms? There are no symptoms of this condition. How is this diagnosed? This condition may be diagnosed from the results of a blood test.  If you are older than age 44, your health care provider may check your cholesterol every 4-6 years.  You may be checked more often if you already have high cholesterol or other risk factors for heart disease. The blood test for cholesterol measures:  "Bad" cholesterol (LDL cholesterol). This is the main type of cholesterol that causes heart disease. The desired level for LDL is less than 100.  "Good" cholesterol (HDL cholesterol). This type helps  to protect against heart disease by cleaning the arteries and carrying the LDL away. The desired level for HDL is 60 or higher.  Triglycerides. These are fats that the body can store or burn for energy. The desired number for triglycerides is lower than 150.  Total cholesterol. This is a measure of the total amount of cholesterol in your blood, including LDL cholesterol, HDL cholesterol, and triglycerides. A healthy number is less than 200. How is this treated? This condition is treated with diet changes, lifestyle changes, and medicines. Diet changes  This may include eating more whole grains, fruits, vegetables, nuts, and fish.  This may also include cutting back on red meat and foods that have a lot of added sugar. Lifestyle changes  Changes may include getting at least 40 minutes of aerobic exercise 3 times a week. Aerobic exercises include walking, biking, and swimming. Aerobic exercise along with a healthy diet can help you maintain a healthy weight.  Changes may also include quitting smoking. Medicines  Medicines are usually given if diet and lifestyle changes have failed to reduce  your cholesterol to healthy levels.  Your health care provider may prescribe a statin medicine. Statin medicines have been shown to reduce cholesterol, which can reduce the risk of heart disease. Follow these instructions at home: Eating and drinking If told by your health care provider:  Eat chicken (without skin), fish, veal, shellfish, ground [place] breast, and round or loin cuts of red meat.  Do not eat fried foods or fatty meats, such as hot dogs and salami.  Eat plenty of fruits, such as apples.  Eat plenty of vegetables, such as broccoli, potatoes, and carrots.  Eat beans, peas, and lentils.  Eat grains such as barley, rice, couscous, and bulgur wheat.  Eat pasta without cream sauces.  Use skim or nonfat milk, and eat low-fat or nonfat yogurt and cheeses.  Do not eat or drink whole milk, cream, ice cream, egg yolks, or hard cheeses.  Do not eat stick margarine or tub margarines that contain trans fats (also called partially hydrogenated oils).  Do not eat saturated tropical oils, such as coconut oil and palm oil.  Do not eat cakes, cookies, crackers, or other baked goods that contain trans fats.  General instructions  Exercise as directed by your health care provider. Increase your activity level with activities such as gardening, walking, and taking the stairs.  Take over-the-counter and prescription medicines only as told by your health care provider.  Do not use any products that contain nicotine or tobacco, such as cigarettes and e-cigarettes. If you need help quitting, ask your health care provider.  Keep all follow-up visits as told by your health care provider. This is important. Contact a health care provider if:  You are struggling to maintain a healthy diet or weight.  You need help to start on an exercise program.  You need help to stop smoking. Get help right away if:  You have chest pain.  You have trouble breathing. This information is not  intended to replace advice given to you by your health care provider. Make sure you discuss any questions you have with your health care provider. Document Revised: 06/08/2017 Document Reviewed: 12/04/2015 Elsevier Patient Education  2020 [employer].  Cholesterol Content in Foods Cholesterol is a waxy, fat-like substance that helps to carry fat in the blood. The body needs cholesterol in small amounts, but too much cholesterol can cause damage to the arteries and heart. Most people should  eat less than 200 milligrams (mg) of cholesterol a day. Foods with cholesterol  Cholesterol is found in animal-based foods, such as meat, seafood, and dairy. Generally, low-fat dairy and lean meats have less cholesterol than full-fat dairy and fatty meats. The milligrams of cholesterol per serving (mg per serving) of common cholesterol-containing foods are listed below. Meat and other proteins  Egg -- one large whole egg has 186 mg.  Veal shank -- 4 oz has 141 mg.  Lean ground [place] (93% lean) -- 4 oz has 118 mg.  Fat-trimmed lamb loin -- 4 oz has 106 mg.  Lean ground beef (90% lean) -- 4 oz has 100 mg.  Lobster -- 3.5 oz has 90 mg.  Pork loin chops -- 4 oz has 86 mg.  Canned salmon -- 3.5 oz has 83 mg.  Fat-trimmed beef top loin -- 4 oz has 78 mg.  Frankfurter -- 1 frank (3.5 oz) has 77 mg.  Crab -- 3.5 oz has 71 mg.  Roasted chicken without skin, white meat -- 4 oz has 66 mg.  Light bologna -- 2 oz has 45 mg.  Deli-cut [place] -- 2 oz has 31 mg.  Canned tuna -- 3.5 oz has 31 mg.  Tomasa BlaseBacon -- 1 oz has 29 mg.  Oysters and mussels (raw) -- 3.5 oz has 25 mg.  Mackerel -- 1 oz has 22 mg.  Trout -- 1 oz has 20 mg.  Pork sausage -- 1 link (1 oz) has 17 mg.  Salmon -- 1 oz has 16 mg.  Tilapia -- 1 oz has 14 mg. Dairy  Soft-serve ice cream --  cup (4 oz) has 103 mg.  Whole-milk yogurt -- 1 cup (8 oz) has 29 mg.  Cheddar cheese -- 1 oz has 28 mg.  American cheese -- 1 oz has  28 mg.  Whole milk -- 1 cup (8 oz) has 23 mg.  2% milk -- 1 cup (8 oz) has 18 mg.  Cream cheese -- 1 tablespoon (Tbsp) has 15 mg.  Cottage cheese --  cup (4 oz) has 14 mg.  Low-fat (1%) milk -- 1 cup (8 oz) has 10 mg.  Sour cream -- 1 Tbsp has 8.5 mg.  Low-fat yogurt -- 1 cup (8 oz) has 8 mg.  Nonfat Greek yogurt -- 1 cup (8 oz) has 7 mg.  Half-and-half cream -- 1 Tbsp has 5 mg. Fats and oils  Cod liver oil -- 1 tablespoon (Tbsp) has 82 mg.  Butter -- 1 Tbsp has 15 mg.  Lard -- 1 Tbsp has 14 mg.  Bacon grease -- 1 Tbsp has 14 mg.  Mayonnaise -- 1 Tbsp has 5-10 mg.  Margarine -- 1 Tbsp has 3-10 mg. Exact amounts of cholesterol in these foods may vary depending on specific ingredients and brands. Foods without cholesterol Most plant-based foods do not have cholesterol unless you combine them with a food that has cholesterol. Foods without cholesterol include:  Grains and cereals.  Vegetables.  Fruits.  Vegetable oils, such as olive, canola, and sunflower oil.  Legumes, such as peas, beans, and lentils.  Nuts and seeds.  Egg whites. Summary  The body needs cholesterol in small amounts, but too much cholesterol can cause damage to the arteries and heart.  Most people should eat less than 200 milligrams (mg) of cholesterol a day. This information is not intended to replace advice given to you by your health care provider. Make sure you discuss any questions you have with your health  care provider. Document Revised: 05/18/2017 Document Reviewed: 01/30/2017 Elsevier Patient Education  [place].

## 2019-12-08 IMAGING — CT CT ABD-PELV W/ CM
2 of 5 series · 14 of 46 positions shown, 16 images · IV contrast (iopamidol)
Comparison: None.

CLINICAL DATA: Abdominal pain and distension

EXAM:
CT ABDOMEN AND PELVIS WITH CONTRAST
TECHNIQUE: Multidetector CT imaging of the abdomen and pelvis was performed
using the standard protocol following bolus administration of
intravenous contrast. Oral contrast was also administered.
CONTRAST:  100mL GLA5DV-FCC IOPAMIDOL (GLA5DV-FCC) INJECTION 61%

[Series 2: abd pelvis · axial · 0.57mm/px · z∈[-1578,-1208]mm · 11 of 84 slices shown, 13 images (1 of 2)]
[im 5/84  soft-tissue]
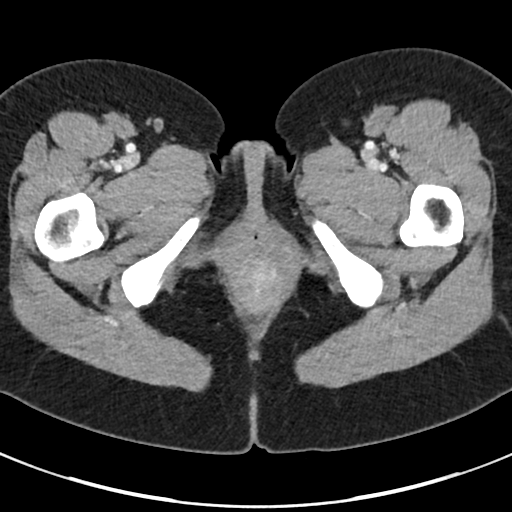
[im 5/84  bone]
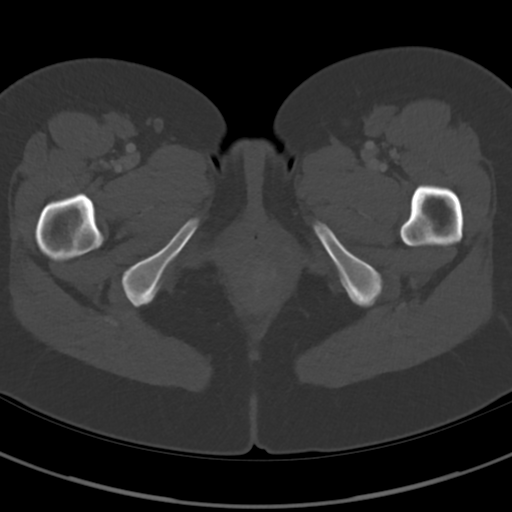
[im 14/84  soft-tissue]
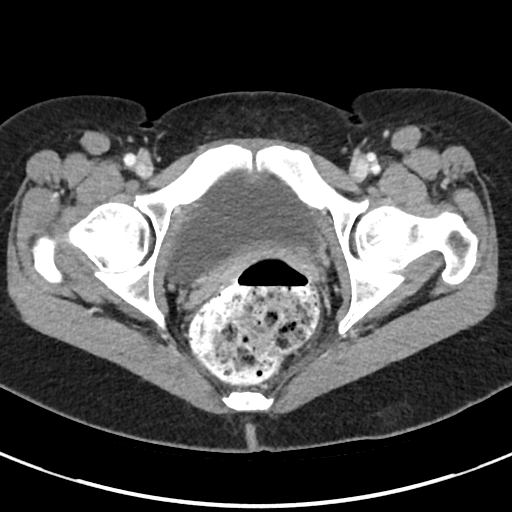
[im 22/84  soft-tissue]
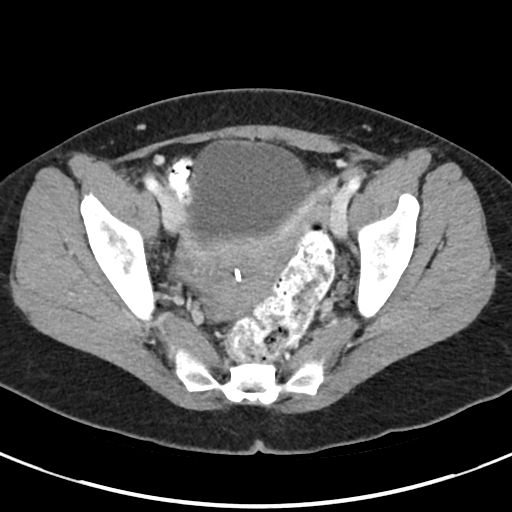
[im 27/84  soft-tissue]
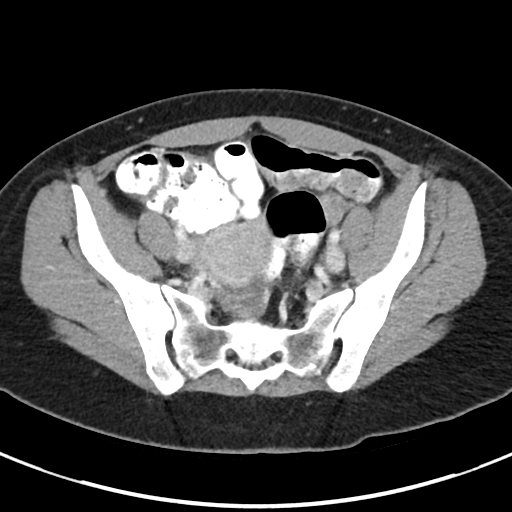
[im 35/84  soft-tissue]
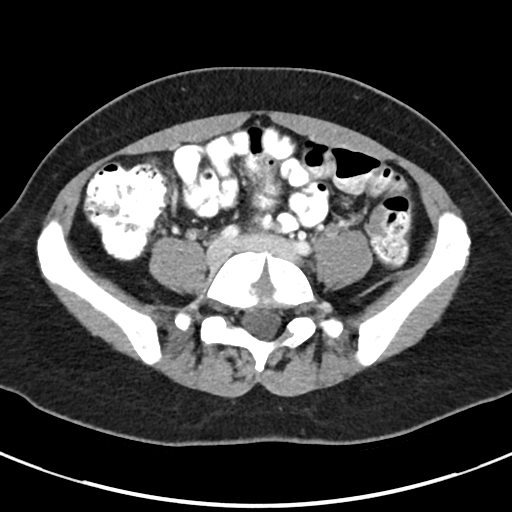
[im 44/84  soft-tissue]
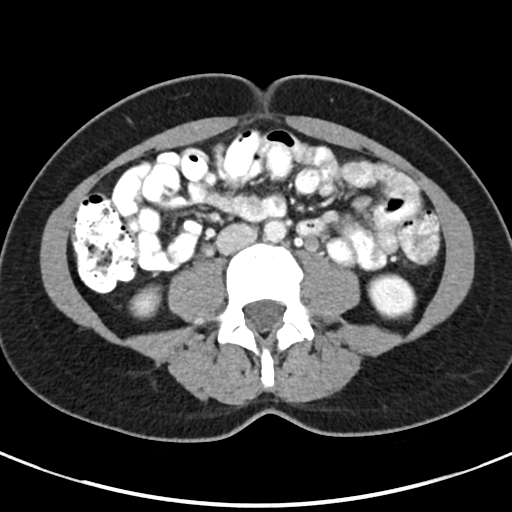
[im 49/84  soft-tissue]
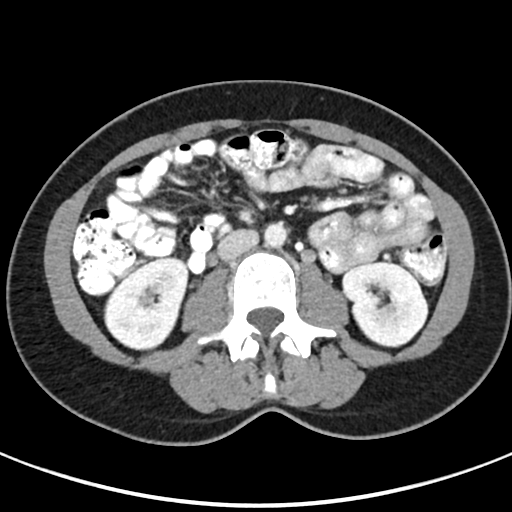
[im 57/84  soft-tissue]
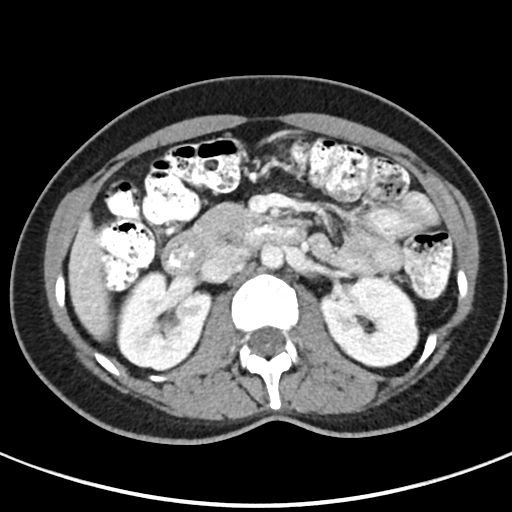
[im 62/84  soft-tissue]
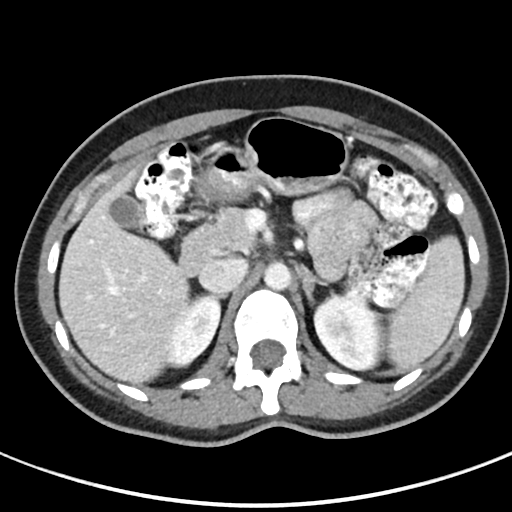
[im 62/84  bone]
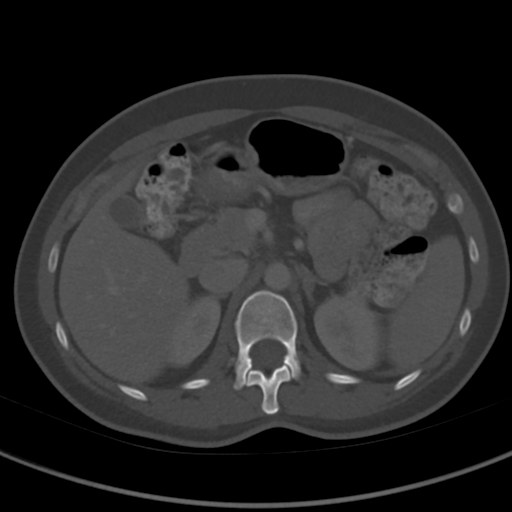
[im 70/84  soft-tissue]
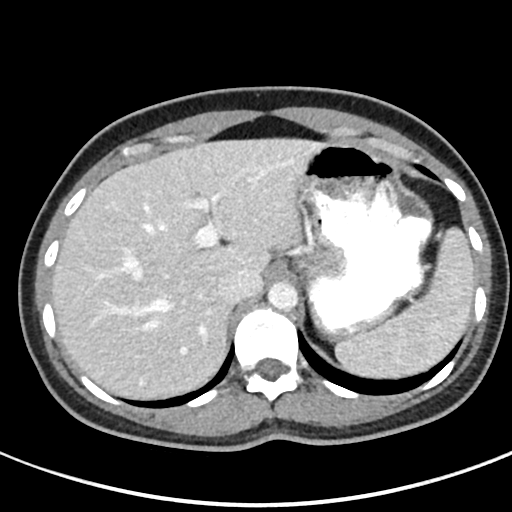
[im 79/84  soft-tissue]
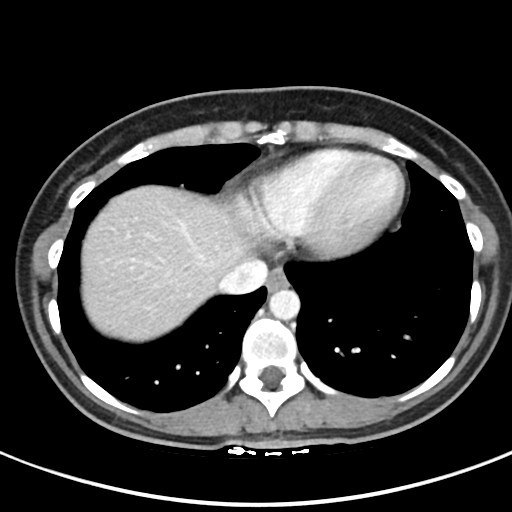

[Series 4: abd pelvis · coronal · 0.57mm/px · 3 of 102 slices shown (2 of 2)]
[im 34/102  soft-tissue]
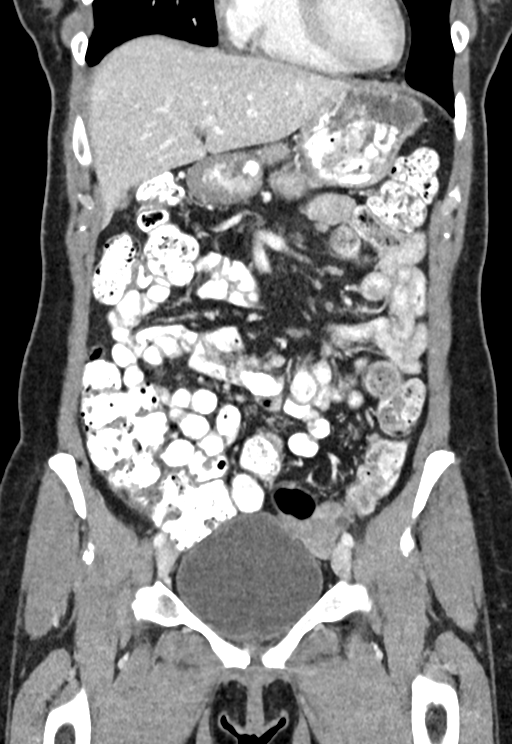
[im 45/102  soft-tissue]
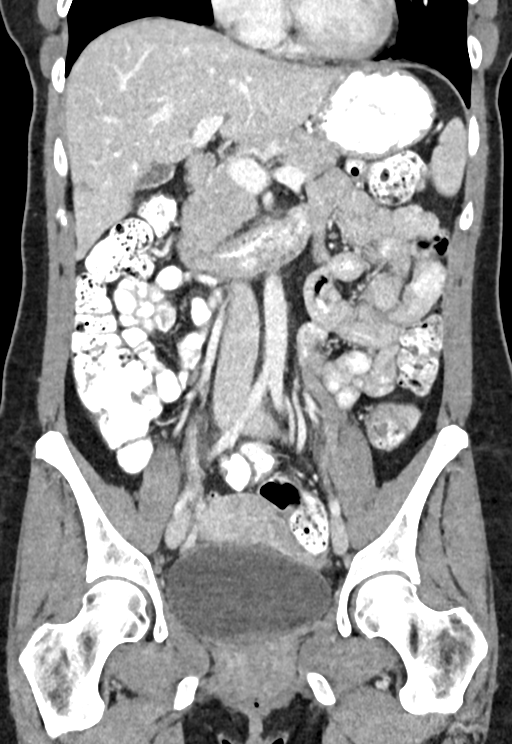
[im 57/102  soft-tissue]
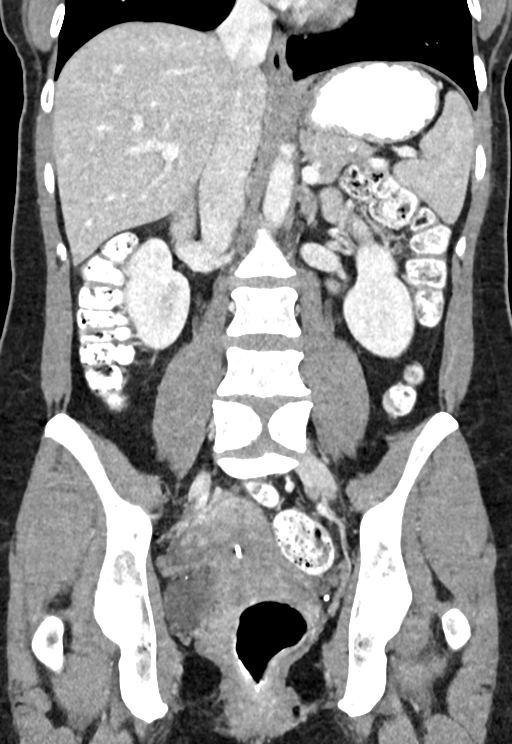

[14 of 46 positions shown; findings below may reference images not displayed]

FINDINGS: Lower chest: Lung bases are clear.

Hepatobiliary: There is an apparent small hemangioma in the right
lobe of the liver near the gallbladder fossa measuring 1.4 x 0.8 cm.
No other focal liver lesion is appreciable. Note that there is fatty
infiltration near the fissure for the ligamentum teres. Gallbladder
wall is not appreciably thickened. There is no biliary duct
dilatation.

Pancreas: There is no evident pancreatic mass or inflammatory focus.

Spleen: No splenic lesions are appreciable.

Adrenals/Urinary Tract: Adrenals bilaterally appear unremarkable.
There is no evident renal mass or hydronephrosis on either side.
There is no renal or ureteral calculus on either side. Urinary
bladder is midline with wall thickness within normal limits.

Stomach/Bowel: There is stool throughout much of the colon. There is
no appreciable bowel wall or mesenteric thickening. There is no
appreciable bowel obstruction. There is no free air or portal venous
air.

Vascular/Lymphatic: There is no abdominal aortic aneurysm. There is
mild aortic atherosclerosis. Major mesenteric arterial vessels
appear patent. There is no evident adenopathy in the abdomen or
pelvis.

Reproductive: Uterus is anteverted. Intrauterine device is
positioned within the endometrium. There is a small amount of
somewhat complex fluid in the cul-de-sac with mixed attenuation in
this area. No well-defined pelvic mass evident.

Other: There is a small appendicolith in the appendix. Appendix
otherwise appears normal. No appendiceal wall thickening or
periappendiceal region inflammation. There is no evident abscess in
the abdomen or pelvis. There is no evident ascites beyond what
appears to be loculated fluid in the cul-de-sac region.

Musculoskeletal: There is a butterfly type vertebral body at L5, an
apparent congenital lesion. No blastic or lytic bone lesions. There
is no intramuscular or abdominal wall lesion.
IMPRESSION: 1. Complex loculated fluid in the cul-de-sac region. Question
hemorrhagic fluid from recent ovarian cyst rupture. Infected fluid
could present similarly. No pelvic mass evident.

2. Stool throughout colon. Question a degree of constipation. No
evident bowel obstruction. No abscess in the abdomen pelvis.
Appendix appears normal with the exception of a small appendicolith
within the appendix. No appendiceal region inflammation evident.

3.  No evident renal or ureteral calculus.  No hydronephrosis.

3. Butterfly vertebra at L5, a congenital anomaly. Clinical
significance of this finding uncertain.

4.  Mild aortic atherosclerosis.  No

5.  Intrauterine device positioned in the endometrium.

Aortic Atherosclerosis ([id number].E).

## 2019-12-09 LAB — HM MAMMOGRAPHY

## 2019-12-16 ENCOUNTER — Telehealth: Payer: Self-pay | Admitting: Internal Medicine

## 2019-12-16 NOTE — Telephone Encounter (Signed)
Please get copy of pap 12/09/19 D.r Ellyn Hack [facility]   Thank you

## 2020-02-11 ENCOUNTER — Other Ambulatory Visit: Payer: Self-pay

## 2020-02-11 ENCOUNTER — Other Ambulatory Visit: Payer: BC Managed Care – PPO

## 2020-02-11 DIAGNOSIS — Z20822 Contact with and (suspected) exposure to covid-19: Secondary | ICD-10-CM

## 2020-02-13 LAB — NOVEL CORONAVIRUS, NAA: SARS-CoV-2, NAA: NOT DETECTED

## 2020-02-13 LAB — SARS-COV-2, NAA 2 DAY TAT

## 2020-02-26 ENCOUNTER — Encounter: Payer: Self-pay | Admitting: Internal Medicine

## 2020-05-26 ENCOUNTER — Ambulatory Visit (INDEPENDENT_AMBULATORY_CARE_PROVIDER_SITE_OTHER): Payer: BC Managed Care – PPO | Admitting: Internal Medicine

## 2020-05-26 ENCOUNTER — Other Ambulatory Visit: Payer: Self-pay

## 2020-05-26 ENCOUNTER — Encounter: Payer: Self-pay | Admitting: Internal Medicine

## 2020-05-26 VITALS — BP 110/80 | HR 88 | Temp 98.4°F | Ht 60.0 in | Wt 132.0 lb

## 2020-05-26 DIAGNOSIS — Z1389 Encounter for screening for other disorder: Secondary | ICD-10-CM | POA: Diagnosis not present

## 2020-05-26 DIAGNOSIS — J309 Allergic rhinitis, unspecified: Secondary | ICD-10-CM

## 2020-05-26 DIAGNOSIS — K219 Gastro-esophageal reflux disease without esophagitis: Secondary | ICD-10-CM

## 2020-05-26 DIAGNOSIS — Z13818 Encounter for screening for other digestive system disorders: Secondary | ICD-10-CM

## 2020-05-26 DIAGNOSIS — Z111 Encounter for screening for respiratory tuberculosis: Secondary | ICD-10-CM | POA: Diagnosis not present

## 2020-05-26 DIAGNOSIS — F419 Anxiety disorder, unspecified: Secondary | ICD-10-CM

## 2020-05-26 DIAGNOSIS — R7612 Nonspecific reaction to cell mediated immunity measurement of gamma interferon antigen response without active tuberculosis: Secondary | ICD-10-CM

## 2020-05-26 DIAGNOSIS — Z0000 Encounter for general adult medical examination without abnormal findings: Secondary | ICD-10-CM | POA: Diagnosis not present

## 2020-05-26 DIAGNOSIS — J45909 Unspecified asthma, uncomplicated: Secondary | ICD-10-CM

## 2020-05-26 DIAGNOSIS — F32A Depression, unspecified: Secondary | ICD-10-CM

## 2020-05-26 DIAGNOSIS — Z1329 Encounter for screening for other suspected endocrine disorder: Secondary | ICD-10-CM | POA: Diagnosis not present

## 2020-05-26 DIAGNOSIS — R109 Unspecified abdominal pain: Secondary | ICD-10-CM

## 2020-05-26 DIAGNOSIS — F410 Panic disorder [episodic paroxysmal anxiety] without agoraphobia: Secondary | ICD-10-CM

## 2020-05-26 LAB — LIPID PANEL
Cholesterol: 225 mg/dL — ABNORMAL HIGH (ref 0–200)
HDL: 58.7 mg/dL (ref >39.00)
LDL Cholesterol: 154 mg/dL — ABNORMAL HIGH (ref 0–99)
NonHDL: 165.91
Total CHOL/HDL Ratio: 4
Triglycerides: 59 mg/dL (ref 0.0–149.0)
VLDL: 11.8 mg/dL (ref 0.0–40.0)

## 2020-05-26 LAB — COMPREHENSIVE METABOLIC PANEL
ALT: 34 U/L (ref 0–35)
AST: 28 U/L (ref 0–37)
Albumin: 4.4 g/dL (ref 3.5–5.2)
Alkaline Phosphatase: 48 U/L (ref 39–117)
BUN: 15 mg/dL (ref 6–23)
CO2: 30 mEq/L (ref 19–32)
Calcium: 9.5 mg/dL (ref 8.4–10.5)
Chloride: 102 mEq/L (ref 96–112)
Creatinine, Ser: 0.7 mg/dL (ref 0.40–1.20)
GFR: 106.03 mL/min (ref >60.00)
Glucose, Bld: 88 mg/dL (ref 70–99)
Potassium: 4.5 mEq/L (ref 3.5–5.1)
Sodium: 137 mEq/L (ref 135–145)
Total Bilirubin: 0.3 mg/dL (ref 0.2–1.2)
Total Protein: 6.7 g/dL (ref 6.0–8.3)

## 2020-05-26 LAB — TSH: TSH: 0.99 u[IU]/mL (ref 0.35–4.50)

## 2020-05-26 LAB — CBC WITH DIFFERENTIAL/PLATELET
Basophils Absolute: 0 10*3/uL (ref 0.0–0.1)
Basophils Relative: 0.5 % (ref 0.0–3.0)
Eosinophils Absolute: 0.1 10*3/uL (ref 0.0–0.7)
Eosinophils Relative: 1.3 % (ref 0.0–5.0)
HCT: 39.3 % (ref 36.0–46.0)
Hemoglobin: 13 g/dL (ref 12.0–15.0)
Lymphocytes Relative: 27.7 % (ref 12.0–46.0)
Lymphs Abs: 2.4 10*3/uL (ref 0.7–4.0)
MCHC: 33 g/dL (ref 30.0–36.0)
MCV: 84.6 fl (ref 78.0–100.0)
Monocytes Absolute: 0.6 10*3/uL (ref 0.1–1.0)
Monocytes Relative: 6.6 % (ref 3.0–12.0)
Neutro Abs: 5.4 10*3/uL (ref 1.4–7.7)
Neutrophils Relative %: 63.9 % (ref 43.0–77.0)
Platelets: 282 10*3/uL (ref 150.0–400.0)
RBC: 4.64 Mil/uL (ref 3.87–5.11)
RDW: 13.7 % (ref 11.5–15.5)
WBC: 8.5 10*3/uL (ref 4.0–10.5)

## 2020-05-26 MED ORDER — FLUTICASONE PROPIONATE 50 MCG/ACT NA SUSP: 2.0000 | Freq: Every day | NASAL | 11 refills | Status: DC | PRN

## 2020-05-26 MED ORDER — DICYCLOMINE HCL 10 MG PO CAPS: 10.0000 mg | ORAL_CAPSULE | Freq: Three times a day (TID) | ORAL | 3 refills | Status: DC

## 2020-05-26 MED ORDER — MONTELUKAST SODIUM 10 MG PO TABS: 10.0000 mg | ORAL_TABLET | Freq: Every day | ORAL | 3 refills | Status: DC

## 2020-05-26 MED ORDER — BUPROPION HCL ER (XL) 150 MG PO TB24: 150.0000 mg | ORAL_TABLET | Freq: Every day | ORAL | 3 refills | Status: DC

## 2020-05-26 MED ORDER — OMEPRAZOLE 20 MG PO CPDR: 20.0000 mg | DELAYED_RELEASE_CAPSULE | Freq: Two times a day (BID) | ORAL | 3 refills | Status: DC

## 2020-05-26 MED ORDER — POLYETHYLENE GLYCOL 3350 17 G PO PACK
17.0000 g | PACK | Freq: Every day | ORAL | 11 refills | Status: DC
Start: 2020-05-26 — End: 2021-04-19

## 2020-05-26 MED ORDER — CETIRIZINE HCL 10 MG PO TABS: 10.0000 mg | ORAL_TABLET | Freq: Every day | ORAL | 3 refills | Status: DC

## 2020-05-26 MED ORDER — SERTRALINE HCL 100 MG PO TABS: 100.0000 mg | ORAL_TABLET | Freq: Every day | ORAL | 3 refills | Status: DC

## 2020-05-26 MED ORDER — LORAZEPAM 0.5 MG PO TABS: ORAL_TABLET | ORAL | 2 refills | Status: DC

## 2020-05-26 MED ORDER — ALBUTEROL SULFATE HFA 108 (90 BASE) MCG/ACT IN AERS: 1.0000 | INHALATION_SPRAY | Freq: Four times a day (QID) | RESPIRATORY_TRACT | 11 refills | Status: DC | PRN

## 2020-05-26 NOTE — Progress Notes (Signed)
Chief Complaint  Patient presents with  . Follow-up   Annual doing well  1. Anxiety and depression controlled on ativan 0.5 qd prn and zoloft 100 mg qd wellbutrin 150 mg qd   Review of Systems  Constitutional: Negative for weight loss.  HENT: Negative for hearing loss.   Eyes: Negative for blurred vision.  Respiratory: Negative for shortness of breath.   Cardiovascular: Negative for chest pain.  Gastrointestinal: Positive for constipation. Negative for abdominal pain.  Musculoskeletal: Negative for falls.  Skin: Negative for rash.  Neurological: Negative for headaches.  Psychiatric/Behavioral: Negative for depression.   Past Medical History:  Diagnosis Date  . Allergy    cedar, tree, dogs, cats   . Anxiety 06/06/2016  . Asthma    allergic   . Depression   . GERD (gastroesophageal reflux disease)   . Hyperlipidemia   . Hypoglycemia    Past Surgical History:  Procedure Laterality Date  . DILATION AND CURETTAGE OF UTERUS    . vocal cord surgery     Family History  Problem Relation Age of Onset  . Hyperlipidemia Mother   . Heart disease Father        heart valve issues s/p heart surgery   . Hypertension Father   . Diabetes Father   . Colon polyps Father        precancerous  . Stroke Father   . Arthritis Maternal Grandmother   . Stroke Maternal Grandmother   . Hyperlipidemia Maternal Grandmother   . Hypertension Maternal Grandfather   . Colon cancer Paternal Grandmother        dx age 7 y.o   . Hypertension Paternal Grandfather   . Diabetes Paternal Grandfather   . Endometriosis Sister   . Hypertension Brother   . Diabetes Brother   . Colon polyps Paternal Aunt   . Breast cancer Neg Hx    Social History   Socioeconomic History  . Marital status: Divorced    Spouse name: Not on file  . Number of children: 2  . Years of education: Not on file  . Highest education level: Not on file  Occupational History  . Occupation: Pharmacist, hospital  Tobacco Use  . Smoking  status: Never Smoker  . Smokeless tobacco: Never Used  Vaping Use  . Vaping Use: Never used  Substance and Sexual Activity  . Alcohol use: Yes    Alcohol/week: 2.0 standard drinks    Types: 2 Standard drinks or equivalent per week    Comment: social  . Drug use: No  . Sexual activity: Yes    Partners: Male  Other Topics Concern  . Not on file  Social History Narrative   Married ? 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    Social Determinants of Health   Financial Resource Strain:   . Difficulty of Paying Living Expenses: Not on file  Food Insecurity:   . Worried About Charity fundraiser in the Last Year: Not on file  . Ran Out of Food in the Last Year: Not on file  Transportation Needs:   . Lack of Transportation (Medical): Not on file  . Lack of Transportation (Non-Medical): Not on file  Physical Activity:   . Days of Exercise per Week: Not on file  . Minutes of Exercise per Session: Not on file  Stress:   . Feeling of Stress : Not on  file  Social Connections:   . Frequency of Communication with Friends and Family: Not on file  . Frequency of Social Gatherings with Friends and Family: Not on file  . Attends Religious Services: Not on file  . Active Member of Clubs or Organizations: Not on file  . Attends Banker Meetings: Not on file  . Marital Status: Not on file  Intimate Partner Violence:   . Fear of Current or Ex-Partner: Not on file  . Emotionally Abused: Not on file  . Physically Abused: Not on file  . Sexually Abused: Not on file   Current Meds  Medication Sig  . albuterol (VENTOLIN HFA) 108 (90 Base) MCG/ACT inhaler Inhale 1-2 puffs into the lungs every 6 (six) hours as needed for wheezing or shortness of breath.  Marland Kitchen buPROPion (WELLBUTRIN XL) 150 MG 24 hr tablet Take 1 tablet (150 mg total) by mouth daily.  . cetirizine (ZYRTEC) 10 MG tablet Take 1 tablet (10 mg total) by mouth  daily.  Marland Kitchen dicyclomine (BENTYL) 10 MG capsule Take 1 capsule (10 mg total) by mouth 3 (three) times daily before meals.  . fluticasone (FLONASE) 50 MCG/ACT nasal spray Place 2 sprays into both nostrils daily as needed for allergies or rhinitis.  Marland Kitchen levonorgestrel (MIRENA) 20 MCG/24HR IUD 1 each by Intrauterine route once.  Marland Kitchen LORazepam (ATIVAN) 0.5 MG tablet 1/2 pill at night as needed prn anxiety  . montelukast (SINGULAIR) 10 MG tablet Take 1 tablet (10 mg total) by mouth at bedtime.  Marland Kitchen omeprazole (PRILOSEC) 20 MG capsule Take 1 capsule (20 mg total) by mouth 2 (two) times daily before a meal.  . polyethylene glycol (MIRALAX) 17 g packet Take 17 g by mouth daily. Titrate up as needed  . sertraline (ZOLOFT) 100 MG tablet Take 1 tablet (100 mg total) by mouth daily.  . [DISCONTINUED] albuterol (VENTOLIN HFA) 108 (90 Base) MCG/ACT inhaler Inhale 1-2 puffs into the lungs every 6 (six) hours as needed for wheezing or shortness of breath.  . [DISCONTINUED] buPROPion (WELLBUTRIN XL) 150 MG 24 hr tablet Take 1 tablet (150 mg total) by mouth daily.  . [DISCONTINUED] cetirizine (ZYRTEC) 10 MG tablet Take 1 tablet (10 mg total) by mouth daily.  . [DISCONTINUED] dicyclomine (BENTYL) 10 MG capsule Take 1 capsule (10 mg total) by mouth 3 (three) times daily before meals.  . [DISCONTINUED] fluticasone (FLONASE) 50 MCG/ACT nasal spray Place 2 sprays into both nostrils daily as needed for allergies or rhinitis.  . [DISCONTINUED] LORazepam (ATIVAN) 0.5 MG tablet 1/2 pill at night as needed prn anxiety  . [DISCONTINUED] montelukast (SINGULAIR) 10 MG tablet Take 1 tablet (10 mg total) by mouth at bedtime.  . [DISCONTINUED] omeprazole (PRILOSEC) 20 MG capsule Take 1 capsule (20 mg total) by mouth 2 (two) times daily before a meal.  . [DISCONTINUED] polyethylene glycol (MIRALAX) 17 g packet Take 17 g by mouth daily. Titrate up as needed  . [DISCONTINUED] sertraline (ZOLOFT) 100 MG tablet Take 1 tablet (100 mg total) by  mouth daily.   Allergies  Allergen Reactions  . Doxycycline Nausea And Vomiting  . Pseudoephedrine Hcl Palpitations  . Sulfa Antibiotics Rash   No results found for this or any previous visit (from the past 2160 hour(s)). Objective  Body mass index is 25.78 kg/m. Wt Readings from Last 3 Encounters:  05/26/20 132 lb (59.9 kg)  11/25/19 127 lb 12.8 oz (58 kg)  07/01/19 132 lb (59.9 kg)   Temp Readings from Last 3 Encounters:  05/26/20 98.4 F (36.9 C) (Oral)  11/25/19 97.9 F (36.6 C)  07/01/19 98.4 F (36.9 C)   BP Readings from Last 3 Encounters:  05/26/20 110/80  11/25/19 114/76  07/01/19 121/79   Pulse Readings from Last 3 Encounters:  05/26/20 88  11/25/19 93  07/01/19 89    Physical Exam Vitals and nursing note reviewed.  Constitutional:      Appearance: Normal appearance. She is well-developed and well-groomed.  HENT:     Head: Normocephalic and atraumatic.  Eyes:     Conjunctiva/sclera: Conjunctivae normal.     Pupils: Pupils are equal, round, and reactive to light.  Cardiovascular:     Rate and Rhythm: Normal rate and regular rhythm.     Heart sounds: Normal heart sounds. No murmur heard.   Pulmonary:     Effort: Pulmonary effort is normal.     Breath sounds: Normal breath sounds.  Abdominal:     Tenderness: There is no abdominal tenderness.  Skin:    General: Skin is warm and dry.  Neurological:     General: No focal deficit present.     Mental Status: She is alert and oriented to person, place, and time. Mental status is at baseline.     Gait: Gait normal.  Psychiatric:        Attention and Perception: Attention and perception normal.        Mood and Affect: Mood and affect normal.        Speech: Speech normal.        Behavior: Behavior normal. Behavior is cooperative.        Thought Content: Thought content normal.        Cognition and Memory: Cognition and memory normal.        Judgment: Judgment normal.     Assessment  Plan  Annual  physical exam - Plan: Comprehensive metabolic panel, Lipid panel, CBC with Differential/Platelet, TSH, Urinalysis, Routine w reflex microscopic, Hepatitis B surface antibody,quantitative  Labs due 05/19/2020 or afterwill call to schedule Had flu shot pending Tdap had 05/13/14 covid 2/2 pfizer booster 12/11 3/3 hep B vaccinescheck titer MMR immune  Declines STD cehck 5/13/20mammocyst left breast and otherwise neg -ordered call to schedule   Get records pap [facility] Dr. Lurline Idol will call and sch  Referredderm in pastpt saw and had check up 2019 vs 2020 -->no issues as of 11/25/19 no need for referral  Colonoscopy had 07/01/19 [id number] with precancerous polpys and grandparent colon cancer (dads mom in 37s) FH precancerous polyp brother Age 66  -rec colonoscopy Q5 years with FH   rec healthy diet and exercise    Anxiety - Plan: sertraline (ZOLOFT) 100 MG tablet, LORazepam (ATIVAN) 0.5 MG tablet  Abdominal spasms - Plan: omeprazole (PRILOSEC) 20 MG capsule, dicyclomine (BENTYL) 10 MG capsule  Gastroesophageal reflux disease without esophagitis - Plan: omeprazole (PRILOSEC) 20 MG capsule  Allergic rhinitis, unspecified seasonality, unspecified trigger - Plan: montelukast (SINGULAIR) 10 MG tablet, fluticasone (FLONASE) 50 MCG/ACT nasal spray, cetirizine (ZYRTEC) 10 MG tablet  Depression, unspecified depression type - Plan: LORazepam (ATIVAN) 0.5 MG tablet  Panic attack - Plan: LORazepam (ATIVAN) 0.5 MG tablet  Anxiety and depression controlled - Plan: buPROPion (WELLBUTRIN XL) 150 MG 24 hr tablet  Extrinsic asthma without complication, unspecified asthma severity, unspecified whether persistent - Plan: albuterol (VENTOLIN HFA) 108 (90 Base) MCG/ACT inhaler    Provider: Dr. Olivia Mackie McLean-Scocuzza-Internal Medicine

## 2020-05-27 LAB — HEPATITIS C ANTIBODY
Hepatitis C Ab: NONREACTIVE
SIGNAL TO CUT-OFF: 0.01 (ref <1.00)

## 2020-05-29 LAB — QUANTIFERON-TB GOLD PLUS
Mitogen-NIL: >10 IU/mL
NIL: 0.04 IU/mL
QuantiFERON-TB Gold Plus: POSITIVE — AB
TB1-NIL: 2.07 IU/mL
TB2-NIL: 1.48 IU/mL

## 2020-05-29 LAB — HEPATITIS B SURFACE ANTIBODY, QUANTITATIVE: Hep B S AB Quant (Post): <5 m[IU]/mL — ABNORMAL LOW (ref >=10)

## 2020-05-31 ENCOUNTER — Encounter: Payer: Self-pay | Admitting: Internal Medicine

## 2020-05-31 ENCOUNTER — Other Ambulatory Visit: Payer: Self-pay | Admitting: Internal Medicine

## 2020-05-31 DIAGNOSIS — R7612 Nonspecific reaction to cell mediated immunity measurement of gamma interferon antigen response without active tuberculosis: Secondary | ICD-10-CM

## 2020-06-01 ENCOUNTER — Ambulatory Visit
Admission: RE | Admit: 2020-06-01 | Discharge: 2020-06-01 | Disposition: A | Discharge status: Home / Self Care | Payer: BC Managed Care – PPO | Source: Ambulatory Visit | Attending: Internal Medicine | Admitting: Internal Medicine

## 2020-06-01 ENCOUNTER — Ambulatory Visit
Admission: RE | Admit: 2020-06-01 | Discharge: 2020-06-01 | Disposition: A | Discharge status: Home / Self Care | Payer: BC Managed Care – PPO | Attending: Internal Medicine | Admitting: Internal Medicine

## 2020-06-01 ENCOUNTER — Other Ambulatory Visit: Payer: Self-pay

## 2020-06-01 DIAGNOSIS — R7612 Nonspecific reaction to cell mediated immunity measurement of gamma interferon antigen response without active tuberculosis: Secondary | ICD-10-CM | POA: Insufficient documentation

## 2020-06-02 ENCOUNTER — Encounter: Payer: Self-pay | Admitting: Internal Medicine

## 2020-06-02 DIAGNOSIS — R7612 Nonspecific reaction to cell mediated immunity measurement of gamma interferon antigen response without active tuberculosis: Secondary | ICD-10-CM | POA: Insufficient documentation

## 2020-06-02 NOTE — Addendum Note (Signed)
Addended by: Quentin Ore on: 06/02/2020 06:36 PM   Modules accepted: Orders

## 2020-06-08 ENCOUNTER — Ambulatory Visit: Payer: BC Managed Care – PPO

## 2020-06-15 ENCOUNTER — Encounter: Payer: Self-pay | Admitting: Infectious Diseases

## 2020-06-15 ENCOUNTER — Ambulatory Visit: Payer: BC Managed Care – PPO | Attending: Infectious Diseases | Admitting: Infectious Diseases

## 2020-06-15 VITALS — BP 120/86 | HR 92 | Temp 98.2°F | Resp 16 | Ht 60.0 in | Wt 129.0 lb

## 2020-06-15 DIAGNOSIS — E785 Hyperlipidemia, unspecified: Secondary | ICD-10-CM | POA: Insufficient documentation

## 2020-06-15 DIAGNOSIS — J45909 Unspecified asthma, uncomplicated: Secondary | ICD-10-CM | POA: Insufficient documentation

## 2020-06-15 DIAGNOSIS — Z79899 Other long term (current) drug therapy: Secondary | ICD-10-CM | POA: Insufficient documentation

## 2020-06-15 DIAGNOSIS — Z793 Long term (current) use of hormonal contraceptives: Secondary | ICD-10-CM | POA: Diagnosis not present

## 2020-06-15 DIAGNOSIS — R7612 Nonspecific reaction to cell mediated immunity measurement of gamma interferon antigen response without active tuberculosis: Secondary | ICD-10-CM | POA: Insufficient documentation

## 2020-06-15 NOTE — Progress Notes (Addendum)
NAME: Veronica [name]  DOB: 07-26-1975  MRN: [id number]  Date/Time: 06/15/2020 10:45 AM   Subjective:   Patient is here for a positive QuantiFERON gold. ? Veronica [name] is a 44 y.o. female with a history of asthma, environmental allergy, hyperlipidemia controlled with diet underwent a routine QuantiFERON gold test during her annual examination. It came back positive and she is referred to me. 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 She asked for this test because her headmaster had positive QuantiFERON gold and was taking treatment. Patient does not have any specific exposure to TB. 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 But she never had any sick contacts to tuberculosis. 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 The PPD was negative then. She had a chest x-ray done on 06/01/2020 it was normal. She does not have any cough or shortness of breath or chest pain or fever or night sweats or loss of appetite or weight.  Past Medical History:  Diagnosis Date  . Allergy    cedar, tree, dogs, cats   . Anxiety 06/06/2016  . Asthma    allergic   . Depression   . GERD (gastroesophageal reflux disease)   . Hyperlipidemia   . Hypoglycemia     Past Surgical History:  Procedure Laterality Date  . DILATION AND CURETTAGE OF UTERUS    . vocal cord surgery      Social History   Socioeconomic History  . Marital status: Divorced    Spouse name: Not on file  . Number of children: 2  . Years of education: Not on file  . Highest education level: Not on file  Occupational History  . Occupation: Runner, broadcasting/film/video  Tobacco Use  . Smoking status: Never Smoker  . Smokeless tobacco: Never Used  Vaping Use  . Vaping Use: Never used  Substance and Sexual Activity  . Alcohol use: Yes    Alcohol/week: 2.0 standard  drinks    Types: 2 Standard drinks or equivalent per week    Comment: social  . Drug use: No  . Sexual activity: Yes    Partners: Male  Other Topics Concern  . Not on file  Social History Narrative   Married ? 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    Social Determinants of Health   Financial Resource Strain: Not on file  Food Insecurity: Not on file  Transportation Needs: Not on file  Physical Activity: Not on file  Stress: Not on file  Social Connections: Not on file  Intimate Partner Violence: Not on file    Family History  Problem Relation Age of Onset  . Hyperlipidemia Mother   . Heart disease Father        heart valve issues s/p heart surgery   . Hypertension Father   . Diabetes Father   . Colon polyps Father        precancerous  . Stroke Father   . Arthritis Maternal Grandmother   . Stroke Maternal Grandmother   . Hyperlipidemia Maternal Grandmother   . Hypertension Maternal Grandfather   . Colon cancer Paternal Grandmother        dx age 46 y.o   . Hypertension  Paternal Grandfather   . Diabetes Paternal Grandfather   . Endometriosis Sister   . Hypertension Brother   . Diabetes Brother   . Colon polyps Paternal Aunt   . Breast cancer Neg Hx    Allergies  Allergen Reactions  . Doxycycline Nausea And Vomiting  . Pseudoephedrine Hcl Palpitations  . Sulfa Antibiotics Rash    ? Current Outpatient Medications  Medication Sig Dispense Refill  . albuterol (VENTOLIN HFA) 108 (90 Base) MCG/ACT inhaler Inhale 1-2 puffs into the lungs every 6 (six) hours as needed for wheezing or shortness of breath. 18 g 11  . buPROPion (WELLBUTRIN XL) 150 MG 24 hr tablet Take 1 tablet (150 mg total) by mouth daily. 90 tablet 3  . cetirizine (ZYRTEC) 10 MG tablet Take 1 tablet (10 mg total) by mouth daily. 90 tablet 3  . dicyclomine (BENTYL) 10 MG capsule Take 1 capsule (10 mg total) by mouth 3 (three) times daily  before meals. 270 capsule 3  . fluticasone (FLONASE) 50 MCG/ACT nasal spray Place 2 sprays into both nostrils daily as needed for allergies or rhinitis. 16 g 11  . levonorgestrel (MIRENA) 20 MCG/24HR IUD 1 each by Intrauterine route once.    Marland Kitchen LORazepam (ATIVAN) 0.5 MG tablet 1/2 pill at night as needed prn anxiety 30 tablet 2  . montelukast (SINGULAIR) 10 MG tablet Take 1 tablet (10 mg total) by mouth at bedtime. 90 tablet 3  . omeprazole (PRILOSEC) 20 MG capsule Take 1 capsule (20 mg total) by mouth 2 (two) times daily before a meal. 180 capsule 3  . polyethylene glycol (MIRALAX) 17 g packet Take 17 g by mouth daily. Titrate up as needed 30 each 11  . sertraline (ZOLOFT) 100 MG tablet Take 1 tablet (100 mg total) by mouth daily. 90 tablet 3   No current facility-administered medications for this visit.     Abtx:  Anti-infectives (From admission, onward)   None      REVIEW OF SYSTEMS:  Const: negative fever, negative chills, negative weight loss Eyes: negative diplopia or visual changes, negative eye pain ENT: negative coryza, negative sore throat Resp: negative cough, hemoptysis, dyspnea Cards: negative for chest pain, palpitations, lower extremity edema GU: negative for frequency, dysuria and hematuria GI: Negative for abdominal pain, diarrhea, bleeding, constipation Skin: negative for rash and pruritus Heme: negative for easy bruising and gum/nose bleeding MS: negative for myalgias, arthralgias, back pain and muscle weakness Neurolo:negative for headaches, dizziness, vertigo, memory problems  Psych: negative for feelings of anxiety, depression  Endocrine: Says she has hypoglycemia. Allergy/Immunology-as above ? Objective:  VITALS:  BP 120/86   Pulse 92   Temp 98.2 F (36.8 C) (Oral)   Resp 16   Ht 5' (1.524 m)   Wt 129 lb (58.5 kg)   LMP  (LMP Unknown)   SpO2 95%   BMI 25.19 kg/m  PHYSICAL EXAM:  General: Looks well Head: Normocephalic, without obvious abnormality,  atraumatic. Eyes: Conjunctivae clear, anicteric sclerae. Pupils are equal ENT Nares normal. No drainage or sinus tenderness. Lips, mucosa, and tongue normal. No Thrush Neck: Supple, symmetrical, no adenopathy, thyroid: non tender no carotid bruit and no JVD. Back: No CVA tenderness. Lungs: Clear to auscultation bilaterally. No Wheezing or Rhonchi. No rales. Heart: Regular rate and rhythm, no murmur, rub or gallop. Abdomen: Soft, non-tender,not distended. Bowel sounds normal. No masses Extremities: atraumatic, no cyanosis. No edema. No clubbing Skin: No rashes or lesions. Or bruising Lymph: Cervical, supraclavicular normal. Neurologic: Grossly non-focal Pertinent  Labs Lab Results CBC    Component Value Date/Time   WBC 8.5 05/26/2020 0831   RBC 4.64 05/26/2020 0831   HGB 13.0 05/26/2020 0831   HCT 39.3 05/26/2020 0831   PLT 282.0 05/26/2020 0831   MCV 84.6 05/26/2020 0831   MCH 28.0 12/04/2017 0823   MCHC 33.0 05/26/2020 0831   RDW 13.7 05/26/2020 0831   LYMPHSABS 2.4 05/26/2020 0831   MONOABS 0.6 05/26/2020 0831   EOSABS 0.1 05/26/2020 0831   BASOSABS 0.0 05/26/2020 0831    CMP Latest Ref Rng & Units 05/26/2020 05/20/2019 05/02/2018  Glucose 70 - 99 mg/dL 88 92 768(T)  BUN 6 - 23 mg/dL 15 14 14   Creatinine 0.40 - 1.20 mg/dL 1.57 2.62  Sodium 135 - 145 mEq/L 137 137 138  Potassium 3.5 - 5.1 mEq/L 4.5 4.0 4.0  Chloride 96 - 112 mEq/L 102 102 103  CO2 19 - 32 mEq/L 30 29 29   Calcium 8.4 - 10.5 mg/dL 9.5 9.4 9.4  Total Protein 6.0 - 8.3 g/dL 6.7 6.9 7.1  Total Bilirubin 0.2 - 1.2 mg/dL 0.3 0.4 0.2  Alkaline Phos 39 - 117 U/L 48 48 43  AST 0 - 37 U/L 28 15 17   ALT 0 - 35 U/L 34 13 14   05/26/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 Impression/Recommendation ?Positive QuantiFERON gold. Patient is not high risk for testing as well as for progression. Negative chest x-ray We will place a PPD to  see the diameter of the reaction. And then will decide on whether to treat her for latent TB.    ? ? ___________________________________________________ Discussed with patient in great detail. We will see her on Thursday to read the PPD. Note:  This document was prepared using Dragon voice recognition software and may include unintentional dictation errors.  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

## 2020-06-15 NOTE — Patient Instructions (Addendum)
You are here for positive quantiferon gold- will do PPD today and check in 48 hrs 05-3020 PPD test is negative only 3 mm erythema no induration- there is a discordance between your quantiferon gold and ppd- will get back to you regarding the plan after review

## 2020-06-17 ENCOUNTER — Ambulatory Visit: Payer: BC Managed Care – PPO | Attending: Infectious Diseases

## 2020-06-23 ENCOUNTER — Other Ambulatory Visit: Payer: Self-pay

## 2020-06-23 DIAGNOSIS — Z20822 Contact with and (suspected) exposure to covid-19: Secondary | ICD-10-CM

## 2020-06-23 DIAGNOSIS — U071 COVID-19: Secondary | ICD-10-CM

## 2020-06-23 HISTORY — DX: COVID-19: U07.1

## 2020-06-24 ENCOUNTER — Encounter: Payer: Self-pay | Admitting: Internal Medicine

## 2020-06-24 LAB — NOVEL CORONAVIRUS, NAA: SARS-CoV-2, NAA: DETECTED — AB

## 2020-06-24 LAB — SARS-COV-2, NAA 2 DAY TAT

## 2020-06-25 ENCOUNTER — Telehealth: Payer: Self-pay | Admitting: Internal Medicine

## 2020-06-25 ENCOUNTER — Encounter: Payer: Self-pay | Admitting: Internal Medicine

## 2020-06-25 NOTE — Telephone Encounter (Signed)
Message added to previous encounter.  °

## 2020-06-25 NOTE — Telephone Encounter (Signed)
Infusion line was called. Patient informed via mychart by Dr French Ana McLean-Scocuzza to be seen at ED with these severe symptoms

## 2020-06-25 NOTE — Telephone Encounter (Signed)
Veronica Sos  [name], Veronica Spillers, MD 9 hours ago (10:23 PM)      I have tested positive for Covid but have had symptoms since Dec 31. I have had a fever for 6 days. I would like to receive treatment if it's available. What are my options?

## 2020-06-25 NOTE — Telephone Encounter (Signed)
Left voicemail with Covid line Patient informed via mychart

## 2020-06-25 NOTE — Telephone Encounter (Signed)
Call covid 19 line for pt +06/23/20

## 2020-06-25 NOTE — Telephone Encounter (Signed)
Call infusion line [phone] Also work pt in virtual appt le[facility] provider if I do not have appt or see below   If you are this bad please go to [facility] emergency room or [facility] ED  We will call our covid infusion line but they are behind 2 days at times and only will contact you if you qualify for the infusion    ===View-only below this line===   ----- Message -----      From:Veronica [name]      Sent:06/25/2020 10:53 AM EST        JS:[email or web address] N McLean-Scocuzza, MD   Subject:Question regarding NOVEL CORONAVIRUS, NAA  Fever for 6 days, shortness of breath, brain fog, no taste, congestion, no energy (exhausted from just walking to the bathroom).

## 2020-06-29 ENCOUNTER — Encounter: Payer: Self-pay | Admitting: *Deleted

## 2020-07-08 ENCOUNTER — Encounter: Payer: Self-pay | Admitting: Internal Medicine

## 2020-07-09 ENCOUNTER — Telehealth: Payer: Self-pay

## 2020-07-09 NOTE — Telephone Encounter (Signed)
Faxed request for last pap notes/info to Dr. Elmon Kirschner at [facility] [phone] on 07/08/20

## 2020-11-12 NOTE — H&P (View-Only) (Signed)
Covid exposure 11-11-2020, covid test scheduled for 11-16-2020 at 900 am 

## 2020-11-12 NOTE — Progress Notes (Signed)
Covid exposure 11-11-2020, covid test scheduled for 11-16-2020 at 900 am

## 2020-11-16 ENCOUNTER — Other Ambulatory Visit: Payer: Self-pay

## 2020-11-16 ENCOUNTER — Other Ambulatory Visit (HOSPITAL_COMMUNITY)
Admission: RE | Admit: 2020-11-16 | Discharge: 2020-11-16 | Disposition: A | Discharge status: Home / Self Care | Payer: BC Managed Care – PPO | Source: Ambulatory Visit | Attending: Obstetrics and Gynecology | Admitting: Obstetrics and Gynecology

## 2020-11-16 ENCOUNTER — Encounter (HOSPITAL_COMMUNITY)
Admission: RE | Admit: 2020-11-16 | Discharge: 2020-11-16 | Disposition: A | Discharge status: Home / Self Care | Payer: BC Managed Care – PPO | Source: Ambulatory Visit | Attending: Obstetrics and Gynecology | Admitting: Obstetrics and Gynecology

## 2020-11-16 ENCOUNTER — Encounter (HOSPITAL_BASED_OUTPATIENT_CLINIC_OR_DEPARTMENT_OTHER): Payer: Self-pay | Admitting: Obstetrics and Gynecology

## 2020-11-16 DIAGNOSIS — Z881 Allergy status to other antibiotic agents status: Secondary | ICD-10-CM | POA: Diagnosis not present

## 2020-11-16 DIAGNOSIS — Z888 Allergy status to other drugs, medicaments and biological substances status: Secondary | ICD-10-CM | POA: Diagnosis not present

## 2020-11-16 DIAGNOSIS — Z8616 Personal history of COVID-19: Secondary | ICD-10-CM | POA: Diagnosis not present

## 2020-11-16 DIAGNOSIS — E785 Hyperlipidemia, unspecified: Secondary | ICD-10-CM | POA: Diagnosis not present

## 2020-11-16 DIAGNOSIS — Z20822 Contact with and (suspected) exposure to covid-19: Secondary | ICD-10-CM | POA: Insufficient documentation

## 2020-11-16 DIAGNOSIS — Z975 Presence of (intrauterine) contraceptive device: Secondary | ICD-10-CM | POA: Diagnosis not present

## 2020-11-16 DIAGNOSIS — Z8249 Family history of ischemic heart disease and other diseases of the circulatory system: Secondary | ICD-10-CM | POA: Diagnosis not present

## 2020-11-16 DIAGNOSIS — Z01812 Encounter for preprocedural laboratory examination: Secondary | ICD-10-CM | POA: Insufficient documentation

## 2020-11-16 DIAGNOSIS — Z302 Encounter for sterilization: Secondary | ICD-10-CM | POA: Diagnosis present

## 2020-11-16 DIAGNOSIS — Z882 Allergy status to sulfonamides status: Secondary | ICD-10-CM | POA: Diagnosis not present

## 2020-11-16 LAB — COMPREHENSIVE METABOLIC PANEL
ALT: 16 U/L (ref 0–44)
AST: 18 U/L (ref 15–41)
Albumin: 4.1 g/dL (ref 3.5–5.0)
Alkaline Phosphatase: 42 U/L (ref 38–126)
Anion gap: 9 (ref 5–15)
BUN: 22 mg/dL — ABNORMAL HIGH (ref 6–20)
CO2: 25 mmol/L (ref 22–32)
Calcium: 9.2 mg/dL (ref 8.9–10.3)
Chloride: 103 mmol/L (ref 98–111)
Creatinine, Ser: 0.72 mg/dL (ref 0.44–1.00)
GFR, Estimated: >60 mL/min (ref >60)
Glucose, Bld: 95 mg/dL (ref 70–99)
Potassium: 4.4 mmol/L (ref 3.5–5.1)
Sodium: 137 mmol/L (ref 135–145)
Total Bilirubin: 0.1 mg/dL — ABNORMAL LOW (ref 0.3–1.2)
Total Protein: 7.2 g/dL (ref 6.5–8.1)

## 2020-11-16 LAB — CBC
HCT: 39.7 % (ref 36.0–46.0)
Hemoglobin: 12.8 g/dL (ref 12.0–15.0)
MCH: 27.9 pg (ref 26.0–34.0)
MCHC: 32.2 g/dL (ref 30.0–36.0)
MCV: 86.7 fL (ref 80.0–100.0)
Platelets: 233 10*3/uL (ref 150–400)
RBC: 4.58 MIL/uL (ref 3.87–5.11)
RDW: 12.7 % (ref 11.5–15.5)
WBC: 9.8 10*3/uL (ref 4.0–10.5)
nRBC: 0 % (ref 0.0–0.2)

## 2020-11-16 LAB — SARS CORONAVIRUS 2 (TAT 6-24 HRS): SARS Coronavirus 2: NEGATIVE

## 2020-11-16 NOTE — H&P (Signed)
Veronica [name] is an 45 y.o. female [phone] for BTL by B salpingectomy.  Pt with undesired fertility.  Wants to maintain IUD.  D/w pt r/b/a of procedure also expectations and process.    Pertinent Gynecological History: [id number] SVD x 2, G1 with PPN, PreE, hyperemesis  MMG 12/09/19 No abn pap, last 06/2016 No STD  Menstrual History:  No LMP recorded. (Menstrual status: IUD).    Past Medical History:  Diagnosis Date  . Allergy    cedar, tree, dogs, cats   . Anxiety 06/06/2016  . Asthma    allergic   . COVID-19    06/23/20  . Depression   . GERD (gastroesophageal reflux disease)   . Hyperlipidemia   . Hypoglycemia     Past Surgical History:  Procedure Laterality Date  . DILATION AND CURETTAGE OF UTERUS    . vocal cord surgery      Family History  Problem Relation Age of Onset  . Hyperlipidemia Mother   . Heart disease Father        heart valve issues s/p heart surgery   . Hypertension Father   . Diabetes Father   . Colon polyps Father        precancerous  . Stroke Father   . Arthritis Maternal Grandmother   . Stroke Maternal Grandmother   . Hyperlipidemia Maternal Grandmother   . Hypertension Maternal Grandfather   . Colon cancer Paternal Grandmother        dx age 59 y.o   . Hypertension Paternal Grandfather   . Diabetes Paternal Grandfather   . Endometriosis Sister   . Hypertension Brother   . Diabetes Brother   . Colon polyps Paternal Aunt   . Breast cancer Neg Hx     Social History:  reports that she has never smoked. She has never used smokeless tobacco. She reports current alcohol use of about 2.0 standard drinks of alcohol per week. She reports that she does not use drugs. single, teacher  Allergies:  Allergies  Allergen Reactions  . Doxycycline Nausea And Vomiting  . Pseudoephedrine Hcl Palpitations  . Sulfa Antibiotics Rash    Meds: albuterol, wellbutrin, dicyclomine, fluticasone, lorazepam, meclizine, Mirena, omeprazole, sertraline, singulair     Review of Systems  Constitutional: Negative.   HENT: Negative.   Respiratory: Negative.   Cardiovascular: Negative.   Gastrointestinal: Negative.   Genitourinary: Negative.   Musculoskeletal: Negative.   Skin: Negative.   Neurological: Negative.   Psychiatric/Behavioral: Negative.     There were no vitals taken for this visit. Physical Exam Constitutional:      Appearance: Normal appearance.  HENT:     Head: Normocephalic and atraumatic.  Cardiovascular:     Rate and Rhythm: Normal rate and regular rhythm.  Pulmonary:     Effort: Pulmonary effort is normal.     Breath sounds: Normal breath sounds.  Abdominal:     General: Bowel sounds are normal.     Palpations: Abdomen is soft.  Genitourinary:    General: Normal vulva.  Musculoskeletal:        General: Normal range of motion.     Cervical back: Normal range of motion and neck supple.  Skin:    General: Skin is warm and dry.  Neurological:     General: No focal deficit present.     Mental Status: She is alert and oriented to person, place, and time.  Psychiatric:        Mood and Affect: Mood normal.  Behavior: Behavior normal.     Results for orders placed or performed during the hospital encounter of 11/16/20 (from the past 24 hour(s))  CBC     Status: None   Collection Time: 11/16/20  8:27 AM  Result Value Ref Range   WBC 9.8 4.0 - 10.5 K/uL   RBC 4.58 3.87 - 5.11 MIL/uL   Hemoglobin 12.8 12.0 - 15.0 g/dL   HCT 42.3 53.6 - 14.4 %   MCV 86.7 80.0 - 100.0 fL   MCH 27.9 26.0 - 34.0 pg   MCHC 32.2 30.0 - 36.0 g/dL   RDW 31.5 40.0 - 86.7 %   Platelets 233 150 - 400 K/uL   nRBC 0.0 0.0 - 0.2 %  Comprehensive metabolic panel     Status: Abnormal   Collection Time: 11/16/20  8:27 AM  Result Value Ref Range   Sodium 137 135 - 145 mmol/L   Potassium 4.4 3.5 - 5.1 mmol/L   Chloride 103 98 - 111 mmol/L   CO2 25 22 - 32 mmol/L   Glucose, Bld 95 70 - 99 mg/dL   BUN 22 (H) 6 - 20 mg/dL   Creatinine,  Ser 6.19 0.44 - 1.00 mg/dL   Calcium 9.2 8.9 - 50.9 mg/dL   Total Protein 7.2 6.5 - 8.1 g/dL   Albumin 4.1 3.5 - 5.0 g/dL   AST 18 15 - 41 U/L   ALT 16 0 - 44 U/L   Alkaline Phosphatase 42 38 - 126 U/L   Total Bilirubin 0.1 (L) 0.3 - 1.2 mg/dL   GFR, Estimated >32 >67 mL/min   Anion gap 9 5 - 15    No results found.  Assessment/Plan: 13WP [id number] w undesired fertility for BTL by B salpingectomy D/w pt r/b/a, process and expectations  Ruhaan Nordahl Bovard-Stuckert 11/16/2020, 3:16 PM

## 2020-11-16 NOTE — Progress Notes (Signed)
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  Clear liquids from MN until---500 am then npo Med rec completed Medications to take morning of surgery -----use/bring albuterol inhaler, wellbutrin, omeprazole, sertraline Diabetic medication -----n/a Patient instructed no nail polish to be worn day of surgery Patient instructed to bring photo id and insurance card day of surgery Patient aware to have Driver (ride ) / caregiver boyfriend virgil holmes will stay    for 24 hours after surgery  Patient Special Instructions -----none Pre-Op special Istructions -----none Patient verbalized understanding of instructions that were given at this phone interview. Patient denies shortness of breath, chest pain, fever, cough at this phone interview.

## 2020-11-18 NOTE — Anesthesia Preprocedure Evaluation (Addendum)
Anesthesia Evaluation  Patient identified by MRN, date of birth, ID band Patient awake    Reviewed: Allergy & Precautions, NPO status , Patient's Chart, lab work & pertinent test results  Airway Mallampati: II  TM Distance: >3 FB Neck ROM: Full    Dental no notable dental hx.    Pulmonary asthma ,  Vocal cord surgery 2008-    Pulmonary exam normal breath sounds clear to auscultation       Cardiovascular negative cardio ROS Normal cardiovascular exam Rhythm:Regular Rate:Normal     Neuro/Psych PSYCHIATRIC DISORDERS Anxiety Depression negative neurological ROS     GI/Hepatic Neg liver ROS, GERD  Controlled and Medicated,  Endo/Other  negative endocrine ROS  Renal/GU negative Renal ROS  negative genitourinary   Musculoskeletal Spina bifida   Abdominal   Peds  Hematology negative hematology ROS (+) hct 39.7   Anesthesia Other Findings   Reproductive/Obstetrics negative OB ROS                            Anesthesia Physical Anesthesia Plan  ASA: II  Anesthesia Plan: General   Post-op Pain Management:    Induction: Intravenous  PONV Risk Score and Plan: 4 or greater and Ondansetron, Dexamethasone, Midazolam, Scopolamine patch - Pre-op and Treatment may vary due to age or medical condition  Airway Management Planned: Oral ETT  Additional Equipment: None  Intra-op Plan:   Post-operative Plan: Extubation in OR  Informed Consent: I have reviewed the patients History and Physical, chart, labs and discussed the procedure including the risks, benefits and alternatives for the proposed anesthesia with the patient or authorized representative who has indicated his/her understanding and acceptance.     Dental advisory given  Plan Discussed with: CRNA  Anesthesia Plan Comments:        Anesthesia Quick Evaluation

## 2020-11-19 ENCOUNTER — Ambulatory Visit (HOSPITAL_BASED_OUTPATIENT_CLINIC_OR_DEPARTMENT_OTHER): Payer: BC Managed Care – PPO | Admitting: Anesthesiology

## 2020-11-19 ENCOUNTER — Other Ambulatory Visit: Payer: Self-pay

## 2020-11-19 ENCOUNTER — Encounter (HOSPITAL_BASED_OUTPATIENT_CLINIC_OR_DEPARTMENT_OTHER)
Admission: RE | Disposition: A | Discharge status: Home Health | Payer: Self-pay | Source: Home / Self Care | Attending: Obstetrics and Gynecology

## 2020-11-19 ENCOUNTER — Ambulatory Visit (HOSPITAL_BASED_OUTPATIENT_CLINIC_OR_DEPARTMENT_OTHER)
Admission: RE | Admit: 2020-11-19 | Discharge: 2020-11-19 | Disposition: A | Discharge status: Home Health | Payer: BC Managed Care – PPO | Attending: Obstetrics and Gynecology | Admitting: Obstetrics and Gynecology

## 2020-11-19 ENCOUNTER — Encounter (HOSPITAL_BASED_OUTPATIENT_CLINIC_OR_DEPARTMENT_OTHER): Payer: Self-pay | Admitting: Obstetrics and Gynecology

## 2020-11-19 DIAGNOSIS — Z881 Allergy status to other antibiotic agents status: Secondary | ICD-10-CM | POA: Insufficient documentation

## 2020-11-19 DIAGNOSIS — Z302 Encounter for sterilization: Secondary | ICD-10-CM | POA: Diagnosis not present

## 2020-11-19 DIAGNOSIS — Z20822 Contact with and (suspected) exposure to covid-19: Secondary | ICD-10-CM | POA: Insufficient documentation

## 2020-11-19 DIAGNOSIS — Z888 Allergy status to other drugs, medicaments and biological substances status: Secondary | ICD-10-CM | POA: Insufficient documentation

## 2020-11-19 DIAGNOSIS — Z975 Presence of (intrauterine) contraceptive device: Secondary | ICD-10-CM | POA: Insufficient documentation

## 2020-11-19 DIAGNOSIS — Z8249 Family history of ischemic heart disease and other diseases of the circulatory system: Secondary | ICD-10-CM | POA: Insufficient documentation

## 2020-11-19 DIAGNOSIS — Z9851 Tubal ligation status: Secondary | ICD-10-CM

## 2020-11-19 DIAGNOSIS — Z8616 Personal history of COVID-19: Secondary | ICD-10-CM | POA: Insufficient documentation

## 2020-11-19 DIAGNOSIS — E785 Hyperlipidemia, unspecified: Secondary | ICD-10-CM | POA: Insufficient documentation

## 2020-11-19 DIAGNOSIS — Z882 Allergy status to sulfonamides status: Secondary | ICD-10-CM | POA: Insufficient documentation

## 2020-11-19 HISTORY — DX: Nonspecific reaction to tuberculin skin test without active tuberculosis: R76.11

## 2020-11-19 HISTORY — DX: Irritable bowel syndrome, unspecified: K58.9

## 2020-11-19 HISTORY — DX: Tubal ligation status: Z98.51

## 2020-11-19 HISTORY — DX: Spina bifida, unspecified: Q05.9

## 2020-11-19 HISTORY — PX: LAPAROSCOPIC BILATERAL SALPINGECTOMY: SHX5889

## 2020-11-19 LAB — POCT PREGNANCY, URINE: Preg Test, Ur: NEGATIVE

## 2020-11-19 SURGERY — SALPINGECTOMY, BILATERAL, LAPAROSCOPIC
Anesthesia: General | Site: Abdomen | Laterality: Bilateral

## 2020-11-19 MED ORDER — HYDROMORPHONE HCL 1 MG/ML IJ SOLN
INTRAMUSCULAR | Status: AC
  Filled 2020-11-19: qty 1

## 2020-11-19 MED ORDER — GABAPENTIN 300 MG PO CAPS
ORAL_CAPSULE | ORAL | Status: AC
  Filled 2020-11-19: qty 1

## 2020-11-19 MED ORDER — HYDROMORPHONE HCL 1 MG/ML IJ SOLN
0.2500 mg | INTRAMUSCULAR | Status: DC | PRN
  Administered 2020-11-19 (×2): 0.25 mg via INTRAVENOUS

## 2020-11-19 MED ORDER — PROMETHAZINE HCL 25 MG/ML IJ SOLN: 6.2500 mg | INTRAMUSCULAR | Status: DC | PRN

## 2020-11-19 MED ORDER — PROPOFOL 10 MG/ML IV BOLUS
INTRAVENOUS | Status: AC
  Filled 2020-11-19: qty 20

## 2020-11-19 MED ORDER — ROCURONIUM BROMIDE 10 MG/ML (PF) SYRINGE
PREFILLED_SYRINGE | INTRAVENOUS | Status: DC | PRN
  Administered 2020-11-19: 60 mg via INTRAVENOUS

## 2020-11-19 MED ORDER — ACETAMINOPHEN 500 MG PO TABS: 1000.0000 mg | ORAL_TABLET | ORAL | Status: DC

## 2020-11-19 MED ORDER — ARTIFICIAL TEARS OPHTHALMIC OINT
TOPICAL_OINTMENT | OPHTHALMIC | Status: AC
  Filled 2020-11-19: qty 3.5

## 2020-11-19 MED ORDER — BUPIVACAINE HCL (PF) 0.25 % IJ SOLN
INTRAMUSCULAR | Status: DC | PRN
  Administered 2020-11-19: 15 mL

## 2020-11-19 MED ORDER — OXYCODONE HCL 5 MG/5ML PO SOLN
5.0000 mg | Freq: Once | ORAL | Status: AC | PRN
Start: 2020-11-19 — End: 2020-11-19

## 2020-11-19 MED ORDER — LIDOCAINE 2% (20 MG/ML) 5 ML SYRINGE
INTRAMUSCULAR | Status: AC
  Filled 2020-11-19: qty 5

## 2020-11-19 MED ORDER — PROPOFOL 10 MG/ML IV BOLUS
INTRAVENOUS | Status: DC | PRN
  Administered 2020-11-19: 150 mg via INTRAVENOUS
  Administered 2020-11-19: 40 mg via INTRAVENOUS

## 2020-11-19 MED ORDER — OXYCODONE HCL 5 MG PO TABS
ORAL_TABLET | ORAL | Status: AC
  Filled 2020-11-19: qty 1

## 2020-11-19 MED ORDER — FENTANYL CITRATE (PF) 250 MCG/5ML IJ SOLN
INTRAMUSCULAR | Status: AC
  Filled 2020-11-19: qty 5

## 2020-11-19 MED ORDER — KETOROLAC TROMETHAMINE 30 MG/ML IJ SOLN: 30.0000 mg | Freq: Once | INTRAMUSCULAR | Status: DC | PRN

## 2020-11-19 MED ORDER — MEPERIDINE HCL 25 MG/ML IJ SOLN: 6.2500 mg | INTRAMUSCULAR | Status: DC | PRN

## 2020-11-19 MED ORDER — FENTANYL CITRATE (PF) 250 MCG/5ML IJ SOLN
INTRAMUSCULAR | Status: DC | PRN
  Administered 2020-11-19: 100 ug via INTRAVENOUS
  Administered 2020-11-19: 50 ug via INTRAVENOUS

## 2020-11-19 MED ORDER — KETAMINE HCL 10 MG/ML IJ SOLN
INTRAMUSCULAR | Status: DC | PRN
  Administered 2020-11-19: 30 mg via INTRAVENOUS

## 2020-11-19 MED ORDER — LACTATED RINGERS IV SOLN: INTRAVENOUS | Status: DC

## 2020-11-19 MED ORDER — MIDAZOLAM HCL 5 MG/5ML IJ SOLN
INTRAMUSCULAR | Status: DC | PRN
  Administered 2020-11-19: 2 mg via INTRAVENOUS

## 2020-11-19 MED ORDER — MIDAZOLAM HCL 2 MG/2ML IJ SOLN
INTRAMUSCULAR | Status: AC
  Filled 2020-11-19: qty 2

## 2020-11-19 MED ORDER — FENTANYL CITRATE (PF) 100 MCG/2ML IJ SOLN: INTRAMUSCULAR | Status: DC | PRN

## 2020-11-19 MED ORDER — OXYCODONE HCL 5 MG PO TABS
5.0000 mg | ORAL_TABLET | Freq: Once | ORAL | Status: AC | PRN
Start: 2020-11-19 — End: 2020-11-19
  Administered 2020-11-19: 5 mg via ORAL

## 2020-11-19 MED ORDER — LIDOCAINE 2% (20 MG/ML) 5 ML SYRINGE
INTRAMUSCULAR | Status: DC | PRN
  Administered 2020-11-19: 60 mg via INTRAVENOUS

## 2020-11-19 MED ORDER — ONDANSETRON HCL 4 MG/2ML IJ SOLN
INTRAMUSCULAR | Status: DC | PRN
  Administered 2020-11-19: 4 mg via INTRAVENOUS

## 2020-11-19 MED ORDER — GABAPENTIN 300 MG PO CAPS
300.0000 mg | ORAL_CAPSULE | ORAL | Status: AC
  Administered 2020-11-19: 300 mg via ORAL

## 2020-11-19 MED ORDER — OXYCODONE HCL 5 MG PO TABS: 5.0000 mg | ORAL_TABLET | Freq: Four times a day (QID) | ORAL | 0 refills | Status: DC | PRN

## 2020-11-19 MED ORDER — KETOROLAC TROMETHAMINE 30 MG/ML IJ SOLN
INTRAMUSCULAR | Status: DC | PRN
  Administered 2020-11-19: 30 mg via INTRAVENOUS

## 2020-11-19 MED ORDER — SCOPOLAMINE 1 MG/3DAYS TD PT72
1.0000 | MEDICATED_PATCH | TRANSDERMAL | Status: DC
  Administered 2020-11-19: 1.5 mg via TRANSDERMAL

## 2020-11-19 MED ORDER — ONDANSETRON HCL 4 MG/2ML IJ SOLN
INTRAMUSCULAR | Status: AC
  Filled 2020-11-19: qty 2

## 2020-11-19 MED ORDER — SCOPOLAMINE 1 MG/3DAYS TD PT72
MEDICATED_PATCH | TRANSDERMAL | Status: AC
  Filled 2020-11-19: qty 1

## 2020-11-19 MED ORDER — IBUPROFEN 800 MG PO TABS: 800.0000 mg | ORAL_TABLET | Freq: Three times a day (TID) | ORAL | 1 refills | Status: DC | PRN

## 2020-11-19 MED ORDER — KETOROLAC TROMETHAMINE 30 MG/ML IJ SOLN
INTRAMUSCULAR | Status: AC
  Filled 2020-11-19: qty 1

## 2020-11-19 MED ORDER — DEXAMETHASONE SODIUM PHOSPHATE 10 MG/ML IJ SOLN
INTRAMUSCULAR | Status: DC | PRN
  Administered 2020-11-19: 10 mg via INTRAVENOUS

## 2020-11-19 MED ORDER — KETAMINE HCL 50 MG/5ML IJ SOSY
PREFILLED_SYRINGE | INTRAMUSCULAR | Status: AC
  Filled 2020-11-19: qty 5

## 2020-11-19 MED ORDER — ACETAMINOPHEN 500 MG PO TABS
ORAL_TABLET | ORAL | Status: AC
  Filled 2020-11-19: qty 2

## 2020-11-19 MED ORDER — DEXAMETHASONE SODIUM PHOSPHATE 10 MG/ML IJ SOLN
INTRAMUSCULAR | Status: AC
  Filled 2020-11-19: qty 1

## 2020-11-19 MED ORDER — SUGAMMADEX SODIUM 200 MG/2ML IV SOLN
INTRAVENOUS | Status: DC | PRN
  Administered 2020-11-19: 200 mg via INTRAVENOUS

## 2020-11-19 MED ORDER — POVIDONE-IODINE 10 % EX SWAB: 2.0000 "application " | Freq: Once | CUTANEOUS | Status: DC

## 2020-11-19 MED ORDER — ROCURONIUM BROMIDE 10 MG/ML (PF) SYRINGE
PREFILLED_SYRINGE | INTRAVENOUS | Status: AC
  Filled 2020-11-19: qty 10

## 2020-11-19 SURGICAL SUPPLY — 36 items
ADH SKN CLS APL DERMABOND .7 (GAUZE/BANDAGES/DRESSINGS) ×1
BAG RETRIEVAL 10 (BASKET)
CABLE HIGH FREQUENCY MONO STRZ (ELECTRODE) IMPLANT
COVER MAYO STAND STRL (DRAPES) ×2 IMPLANT
COVER WAND RF STERILE (DRAPES) ×2 IMPLANT
DERMABOND ADVANCED (GAUZE/BANDAGES/DRESSINGS) ×1
DERMABOND ADVANCED .7 DNX12 (GAUZE/BANDAGES/DRESSINGS) ×1 IMPLANT
DRSG OPSITE POSTOP 3X4 (GAUZE/BANDAGES/DRESSINGS) IMPLANT
DURAPREP 26ML APPLICATOR (WOUND CARE) ×2 IMPLANT
GAUZE 4X4 16PLY RFD (DISPOSABLE) ×2 IMPLANT
GAUZE VASELINE 3X9 (GAUZE/BANDAGES/DRESSINGS) IMPLANT
GLOVE SURG ENC MOIS LTX SZ6.5 (GLOVE) ×4 IMPLANT
GLOVE SURG UNDER POLY LF SZ6.5 (GLOVE) ×1 IMPLANT
GLOVE SURG UNDER POLY LF SZ7 (GLOVE) ×1 IMPLANT
GLOVE SURG UNDER POLY LF SZ7.5 (GLOVE) ×1 IMPLANT
GOWN STRL REUS W/TWL LRG LVL3 (GOWN DISPOSABLE) ×6 IMPLANT
KIT TURNOVER CYSTO (KITS) ×2 IMPLANT
NEEDLE INSUFFLATION 120MM (ENDOMECHANICALS) ×2 IMPLANT
NS IRRIG 500ML POUR BTL (IV SOLUTION) ×2 IMPLANT
PACK LAPAROSCOPY BASIN (CUSTOM PROCEDURE TRAY) ×2 IMPLANT
PACK WEDGE TRENDGUARD 450 PROC (MISCELLANEOUS) ×1 IMPLANT
SET SUCTION IRRIG HYDROSURG (IRRIGATION / IRRIGATOR) IMPLANT
SET TUBE SMOKE EVAC HIGH FLOW (TUBING) ×2 IMPLANT
SHEARS HARMONIC ACE PLUS 36CM (ENDOMECHANICALS) ×1 IMPLANT
SUT VIC AB 2-0 CT2 27 (SUTURE) IMPLANT
SUT VIC AB 4-0 PS2 18 (SUTURE) ×2 IMPLANT
SUT VICRYL 0 UR6 27IN ABS (SUTURE) IMPLANT
SYS BAG RETRIEVAL 10MM (BASKET)
SYSTEM BAG RETRIEVAL 10MM (BASKET) IMPLANT
TOWEL OR 17X26 10 PK STRL BLUE (TOWEL DISPOSABLE) ×2 IMPLANT
TRAY FOLEY W/BAG SLVR 14FR LF (SET/KITS/TRAYS/PACK) ×2 IMPLANT
TRENDGUARD 450 WEDGE PROC PACK (MISCELLANEOUS) ×2
TROCAR BALLN 12MMX100 BLUNT (TROCAR) IMPLANT
TROCAR BLADELESS OPT 5 100 (ENDOMECHANICALS) ×5 IMPLANT
TROCAR XCEL NON-BLD 11X100MML (ENDOMECHANICALS) IMPLANT
WARMER LAPAROSCOPE (MISCELLANEOUS) ×2 IMPLANT

## 2020-11-19 NOTE — Anesthesia Procedure Notes (Signed)
Procedure Name: Intubation Date/Time: 11/19/2020 7:48 AM Performed by: Rogers Blocker, CRNA Pre-anesthesia Checklist: Patient identified, Emergency Drugs available, Suction available and Patient being monitored Patient Re-evaluated:Patient Re-evaluated prior to induction Oxygen Delivery Method: Circle System Utilized Preoxygenation: Pre-oxygenation with 100% oxygen Induction Type: IV induction Ventilation: Mask ventilation without difficulty Laryngoscope Size: Mac and 3 Grade View: Grade I Tube type: Oral Number of attempts: 1 Airway Equipment and Method: Stylet Placement Confirmation: ETT inserted through vocal cords under direct vision,  positive ETCO2 and breath sounds checked- equal and bilateral Secured at: 22 cm Tube secured with: Tape Dental Injury: Teeth and Oropharynx as per pre-operative assessment

## 2020-11-19 NOTE — Brief Op Note (Signed)
11/19/2020  9:04 AM  PATIENT:  Veronica [name]  45 y.o. female  PRE-OPERATIVE DIAGNOSIS:  sterilization  POST-OPERATIVE DIAGNOSIS:  sterilization  PROCEDURE:  Procedure(s): LAPAROSCOPIC BILATERAL SALPINGECTOMY (Bilateral)  SURGEON:  Surgeon(s) and Role:    * Bovard-Stuckert, Hadasah Brugger, MD - Primary  ASSISTANTS:Lela, RNFA  ANESTHESIA:   local and general by LMA  EBL:  10 mL IVF and uop per anesthesia  DRAINS: Urinary Catheter (Foley) d/c after case   LOCAL MEDICATIONS USED:  marcaine  SPECIMEN:  Source of Specimen:  B tubes  DISPOSITION OF SPECIMEN:  PATHOLOGY  COUNTS:  YES  TOURNIQUET:  * No tourniquets in log *  DICTATION: .Other Dictation: Dictation Number [id number]  PLAN OF CARE: Discharge to home after PACU  PATIENT DISPOSITION:  PACU - hemodynamically stable.   Delay start of Pharmacological VTE agent (>24hrs) due to surgical blood loss or risk of bleeding: not applicable

## 2020-11-19 NOTE — Op Note (Signed)
[name], [name] MEDICAL RECORD NO: [id number] ACCOUNT NO: [id number] DATE OF BIRTH: 08-08-1975 FACILITY: [facility] LOCATION: [facility] PHYSICIAN: Sherian Rein, MD  Operative Report   DATE OF PROCEDURE: 11/19/2020  PREOPERATIVE DIAGNOSIS:  Undesired fertility, desires bilateral tubal ligation.  POSTOPERATIVE DIAGNOSIS:  Undesired fertility, desires bilateral tubal ligation.  PROCEDURE:  Bilateral tubal ligation by bilateral salpingectomy.  SURGEON:  Sherian Rein, MD   ASSISTANTJackelyn Hoehn, RNFA  TYPE OF ANESTHESIA:  Local and general by LMA.  ESTIMATED BLOOD LOSS:  10 mL.  INTRAVENOUS FLUID AND URINE OUTPUT:  Per anesthesia.  COMPLICATIONS:  None.  PATHOLOGY:  Bilateral tubal segments to pathology.  DESCRIPTION OF PROCEDURE:  After informed consent was reviewed with the patient including risks, benefits and alternatives of the surgical procedure, she was transferred to the operating room and placed on the table in supine position.  General  anesthesia was induced and found to be adequate.  She was then placed in the Yellofin stirrups, prepped and draped in the normal sterile fashion.  Using an open-sided speculum, her cervix was visualized, grasped with a single tooth tenaculum and a Hulka  manipulator was placed.  The gloves and gown were changed and attention was turned to proceeding with the case.  An approximately 5 mm infraumbilical incision was made using a Veress needle.  Pneumoperitoneum was obtained with an opening pressure of 2  mmHg.  The accessory ports were placed on both the right and left, under direct visualization, brief pelvic survey was performed revealing normal tubes and ovaries bilaterally.  The normal liver edge, appendix was unable to be visualized.  The right tube  was grasped at the fimbriated end and using a Harmonic scalpel was isolated to the level of the cornu, it was then excised and removed through a 5 mm trocar.  Attention was turned  back to the left tube, which in a similar fashion was grasped at the  fimbriated end, isolated using Harmonic scalpel and removed through a trocar.  The site was noted to be hemostatic.  The pneumoperitoneum was evacuated.  The ports were closed with 4-0 Vicryl in a subcuticular fashion and Dermabond.  The patient  tolerated the procedure well.  Sponge, lap and needle counts were correct x2 per the operating room staff.   SHW D: 11/19/2020 9:13:29 am T: 11/19/2020 9:57:00 am  JOB: [id number]/ [id number]

## 2020-11-19 NOTE — Anesthesia Postprocedure Evaluation (Signed)
Anesthesia Post Note  Patient: Veronica [name]  Procedure(s) Performed: LAPAROSCOPIC BILATERAL SALPINGECTOMY (Bilateral Abdomen)     Patient location during evaluation: PACU Anesthesia Type: General Level of consciousness: awake and alert, oriented and patient cooperative Pain management: pain level controlled Vital Signs Assessment: post-procedure vital signs reviewed and stable Respiratory status: spontaneous breathing, nonlabored ventilation and respiratory function stable Cardiovascular status: blood pressure returned to baseline and stable Postop Assessment: no apparent nausea or vomiting Anesthetic complications: no   No complications documented.  Last Vitals:  Vitals:   11/19/20 0915 11/19/20 0930  BP: 134/84 123/79  Pulse: 95 98  Resp: 17 20  Temp:    SpO2: 100% 93%    Last Pain:  Vitals:   11/19/20 0915  PainSc: 4                  Lannie Fields

## 2020-11-19 NOTE — Interval H&P Note (Signed)
History and Physical Interval Note:  11/19/2020 7:34 AM  Veronica [name]  has presented today for surgery, with the diagnosis of sterilization.  The various methods of treatment have been discussed with the patient and family. After consideration of risks, benefits and other options for treatment, the patient has consented to  Procedure(s): LAPAROSCOPIC BILATERAL SALPINGECTOMY (Bilateral) as a surgical intervention.  The patient's history has been reviewed, patient examined, no change in status, stable for surgery.  I have reviewed the patient's chart and labs.  Questions were answered to the patient's satisfaction.     [name]

## 2020-11-19 NOTE — Discharge Instructions (Signed)
Salpingectomy, Care After This sheet gives you information about how to care for yourself after your procedure. Your health care provider may also give you more specific instructions. If you have problems or questions, contact your health care provider. What can I expect after the procedure? After the procedure, it is common to have:  Pain in your abdomen.  Some light vaginal bleeding (spotting) for a few days.  Tiredness. Your recovery time will vary depending on which method your surgeon used for your surgery.  Follow these instructions at home: Incision care  Follow instructions from your health care provider about how to take care of your incisions. Make sure you: ? Wash your hands with soap and water before and after you change your bandage (dressing). If soap and water are not available, use hand sanitizer. ? Change or remove your dressing as told by your health care provider. ? Leave any stitches (sutures), skin glue, or adhesive strips in place. These skin closures may need to stay in place for 2 weeks or longer. If adhesive strip edges start to loosen and curl up, you may trim the loose edges. Do not remove adhesive strips completely unless your health care provider tells you to do that.  Keep your dressing clean and dry.  Check your incision area every day for signs of infection. Check for: ? Redness, swelling, or pain that gets worse. ? Fluid or blood. ? Warmth. ? Pus or a bad smell.   Activity  Rest as told by your health care provider.  Avoid sitting for a long time without moving. Get up to take short walks every 1-2 hours. This is important to improve blood flow and breathing. Ask for help if you feel weak or unsteady.  Return to your normal activities as told by your health care provider. Ask your health care provider what activities are safe for you.  Do not drive until your health care provider says that it is safe.  Do not lift anything that is heavier than 10  lb (4.5 kg), or the limit that you are told, until your health care provider says that it is safe. This may be 2-6 weeks depending on your surgery.  Until your health care provider approves: ? Do not douche. ? Do not use tampons. ? Do not have sex. Medicines  Take over-the-counter and prescription medicines only as told by your health care provider.  Ask your health care provider if the medicine prescribed to you: ? Requires you to avoid driving or using heavy machinery. ? Can cause constipation. You may need to take actions to prevent or treat constipation, such as:  Drink enough fluid to keep your urine pale yellow.  Take over-the-counter or prescription medicines.  Eat foods that are high in fiber, such as beans, whole grains, and fresh fruits and vegetables.  Limit foods that are high in fat and processed sugars, such as fried or sweet foods. General instructions  Wear compression stockings as told by your health care provider. These stockings help to prevent blood clots and reduce swelling in your legs.  Do not use any products that contain nicotine or tobacco, such as cigarettes, e-cigarettes, and chewing tobacco. If you need help quitting, ask your health care provider.  Do not take baths, swim, or use a hot tub until your health care provider approves. You may take showers.  Keep all follow-up visits as told by your health care provider. This is important. Contact a health care provider if you have:    Pain when you urinate.  Redness, swelling, or pain around an incision.  Fluid or blood coming from an incision.  Pus or a bad smell coming from an incision.  An incision that feels warm to the touch.  A fever.  Abdominal pain that gets worse or does not get better with medicine.  An incision that starts to break open.  A rash.  Light-headedness.  Nausea and vomiting. Get help right away if you:  Have pain in your chest or leg.  Develop shortness of  breath.  Faint.  Have increased or heavy vaginal bleeding, such as soaking a pad in an hour. Summary  After the procedure, it is common to feel tired, have some pain in your abdomen, and have some light vaginal bleeding for a few days.  Follow instructions from your health care provider about how to take care of your incisions.  Return to your normal activities as told by your health care provider. Ask your health care provider what activities are safe for you.  Do not douche, use tampons, or have sex until your health care provider approves.  Keep all follow-up visits as told by your health care provider. This information is not intended to replace advice given to you by your health care provider. Make sure you discuss any questions you have with your health care provider. Document Revised: 05/27/2018 Document Reviewed: 05/27/2018 Elsevier Patient Education  2021 [employer]   Post Anesthesia Home Care Instructions  Activity: Get plenty of rest for the remainder of the day. A responsible individual must stay with you for 24 hours following the procedure.  For the next 24 hours, DO NOT: -Drive a car -Operate machinery -Drink alcoholic beverages -Take any medication unless instructed by your physician -Make any legal decisions or sign important papers.  Meals: Start with liquid foods such as gelatin or soup. Progress to regular foods as tolerated. Avoid greasy, spicy, heavy foods. If nausea and/or vomiting occur, drink only clear liquids until the nausea and/or vomiting subsides. Call your physician if vomiting continues.  Special Instructions/Symptoms: Your throat may feel dry or sore from the anesthesia or the breathing tube placed in your throat during surgery. If this causes discomfort, gargle with warm salt water. The discomfort should disappear within 24 hours.  If you had a scopolamine patch placed behind your ear for the management of post- operative nausea and/or  vomiting:  1. The medication in the patch is effective for 72 hours, after which it should be removed.  Wrap patch in a tissue and discard in the trash. Wash hands thoroughly with soap and water. 2. You may remove the patch earlier than 72 hours if you experience unpleasant side effects which may include dry mouth, dizziness or visual disturbances. 3. Avoid touching the patch. Wash your hands with soap and water after contact with the patch.      

## 2020-11-19 NOTE — Transfer of Care (Signed)
Immediate Anesthesia Transfer of Care Note  Patient: Veronica [name]  Procedure(s) Performed: LAPAROSCOPIC BILATERAL SALPINGECTOMY (Bilateral Abdomen)  Patient Location: PACU  Anesthesia Type:General  Level of Consciousness: awake, alert , oriented and patient cooperative  Airway & Oxygen Therapy: Patient Spontanous Breathing and Patient connected to face mask oxygen  Post-op Assessment: Report given to RN and Post -op Vital signs reviewed and stable  Post vital signs: Reviewed and stable  Last Vitals:  Vitals Value Taken Time  BP 133/82 11/19/20 0848  Temp 36.4 C 11/19/20 0848  Pulse 99 11/19/20 0853  Resp 15 11/19/20 0853  SpO2 99 % 11/19/20 0853  Vitals shown include unvalidated device data.  Last Pain: There were no vitals filed for this visit.       Complications: No complications documented.

## 2020-11-22 ENCOUNTER — Encounter (HOSPITAL_BASED_OUTPATIENT_CLINIC_OR_DEPARTMENT_OTHER): Payer: Self-pay | Admitting: Obstetrics and Gynecology

## 2020-11-22 LAB — SURGICAL PATHOLOGY

## 2020-11-25 ENCOUNTER — Ambulatory Visit: Payer: BC Managed Care – PPO | Admitting: Infectious Diseases

## 2020-12-14 ENCOUNTER — Ambulatory Visit: Payer: BC Managed Care – PPO | Attending: Infectious Diseases | Admitting: Infectious Diseases

## 2020-12-14 ENCOUNTER — Other Ambulatory Visit
Admission: RE | Admit: 2020-12-14 | Discharge: 2020-12-14 | Disposition: A | Discharge status: Home / Self Care | Payer: BC Managed Care – PPO | Attending: Infectious Diseases | Admitting: Infectious Diseases

## 2020-12-14 ENCOUNTER — Other Ambulatory Visit: Payer: Self-pay

## 2020-12-14 VITALS — BP 125/82 | HR 82 | Resp 16 | Ht 63.0 in | Wt 131.0 lb

## 2020-12-14 DIAGNOSIS — Z8616 Personal history of COVID-19: Secondary | ICD-10-CM | POA: Insufficient documentation

## 2020-12-14 DIAGNOSIS — R7612 Nonspecific reaction to cell mediated immunity measurement of gamma interferon antigen response without active tuberculosis: Secondary | ICD-10-CM

## 2020-12-14 DIAGNOSIS — Z79899 Other long term (current) drug therapy: Secondary | ICD-10-CM | POA: Insufficient documentation

## 2020-12-14 DIAGNOSIS — K589 Irritable bowel syndrome without diarrhea: Secondary | ICD-10-CM | POA: Insufficient documentation

## 2020-12-14 DIAGNOSIS — E785 Hyperlipidemia, unspecified: Secondary | ICD-10-CM | POA: Diagnosis not present

## 2020-12-14 DIAGNOSIS — J45909 Unspecified asthma, uncomplicated: Secondary | ICD-10-CM | POA: Insufficient documentation

## 2020-12-14 NOTE — Progress Notes (Signed)
NAME: Veronica [name]  DOB: 08-18-1975  MRN: [id number]  Date/Time: 12/14/2020 11:50 AM   Subjective:   Follow up visit for positive quantiferon gold I last saw her in Dec 2021 and at that time quantiferon gold was positive but PPD was negative. CXR was negative. She worked in a school choir and was exposed to children from outside of [place] she was not treated-then  she is here for follow up Since her last visit she developed COVID in Dec and had resp symptoms- She has now recovered 80% . No body aches- some chest tightness on exercise No cough no fever no night sweats She also had tubal ligation on 11/19/20 under local and general [facility]    ?The following is taken from my notes from Dec 2021 Veronica [name] is a 45 y.o. female with a history of asthma, environmental allergy, hyperlipidemia controlled with diet underwent a routine QuantiFERON gold test during her annual examination. It came back positive and she is referred to me. 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 She asked for this test because her headmaster had positive QuantiFERON gold and was taking treatment. Patient does not have any specific exposure to TB. Some of her students are immigrants and travel to their country and back. But she never had any sick contacts to tuberculosis. 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 The PPD was negative then. She had a chest x-ray done on 06/01/2020 it was normal. She does not have any cough or shortness of breath or chest pain or fever or night sweats or loss of appetite or weight.  Past Medical History:  Diagnosis Date   Allergy    cedar, tree, dogs, cats    Anxiety 06/06/2016   Asthma    allergic    COVID-19 06/23/2020   all symptoms x 2 weeks all symptoms resolved per pt   Depression    False positive purified protein derivative (PPD)  test 05-2021   chest xray 06-11-2021 normal epic   GERD (gastroesophageal reflux disease)    Hyperlipidemia    Hypoglycemia    IBS (irritable bowel syndrome)    S/P tubal ligation 11/19/2020   Spina bifida (HCC)    caused djd lower back per pt    Past Surgical History:  Procedure Laterality Date   colonscopy  summer 2021   DILATION AND CURETTAGE OF UTERUS  06/13/2002   LAPAROSCOPIC BILATERAL SALPINGECTOMY Bilateral 11/19/2020   Procedure: LAPAROSCOPIC BILATERAL SALPINGECTOMY;  Surgeon: Sherian Rein, MD;  Location: [facility];  Service: Gynecology;  Laterality: Bilateral;   vocal cord surgery  2008   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 , no issues since    Social History   Socioeconomic History   Marital status: Divorced    Spouse name: Not on file   Number of children: 2   Years of education: Not on file   Highest education level: Not on file  Occupational History   Occupation: Teacher  Tobacco Use   Smoking status: Never   Smokeless tobacco: Never  Vaping Use   Vaping Use: Never used  Substance and Sexual Activity   Alcohol use: Yes    Alcohol/week: 2.0 standard drinks    Types: 2 Standard drinks or equivalent per week    Comment: social   Drug  use: No   Sexual activity: Yes    Partners: Male  Other Topics Concern   Not on file  Social History Narrative   Married ? 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    Social Determinants of Health   Financial Resource Strain: Not on file  Food Insecurity: Not on file  Transportation Needs: Not on file  Physical Activity: Not on file  Stress: Not on file  Social Connections: Not on file  Intimate Partner Violence: Not on file    Family History  Problem Relation Age of Onset   Hyperlipidemia Mother    Heart disease Father        heart valve issues s/p heart surgery    Hypertension Father    Diabetes  Father    Colon polyps Father        precancerous   Stroke Father    Arthritis Maternal Grandmother    Stroke Maternal Grandmother    Hyperlipidemia Maternal Grandmother    Hypertension Maternal Grandfather    Colon cancer Paternal Grandmother        dx age 82 y.o    Hypertension Paternal Grandfather    Diabetes Paternal Grandfather    Endometriosis Sister    Hypertension Brother    Diabetes Brother    Colon polyps Paternal Aunt    Breast cancer Neg Hx    Allergies  Allergen Reactions   Doxycycline Nausea And Vomiting   Pseudoephedrine Hcl Palpitations    Sudafed increased heart rate and breathing gets shallow   Sulfa Antibiotics Rash    ? Current Outpatient Medications  Medication Sig Dispense Refill   albuterol (VENTOLIN HFA) 108 (90 Base) MCG/ACT inhaler Inhale 1-2 puffs into the lungs every 6 (six) hours as needed for wheezing or shortness of breath. 18 g 11   buPROPion (WELLBUTRIN XL) 150 MG 24 hr tablet Take 1 tablet (150 mg total) by mouth daily. 90 tablet 3   cetirizine (ZYRTEC) 10 MG tablet Take 1 tablet (10 mg total) by mouth daily. 90 tablet 3   dicyclomine (BENTYL) 10 MG capsule Take 1 capsule (10 mg total) by mouth 3 (three) times daily before meals. 270 capsule 3   fluticasone (FLONASE) 50 MCG/ACT nasal spray Place 2 sprays into both nostrils daily as needed for allergies or rhinitis. 16 g 11   ibuprofen (ADVIL) 800 MG tablet Take 1 tablet (800 mg total) by mouth every 8 (eight) hours as needed for moderate pain. 30 tablet 1   levonorgestrel (MIRENA) 20 MCG/24HR IUD 1 each by Intrauterine route once.     LORazepam (ATIVAN) 0.5 MG tablet 1/2 pill at night as needed prn anxiety 30 tablet 2   montelukast (SINGULAIR) 10 MG tablet Take 1 tablet (10 mg total) by mouth at bedtime. 90 tablet 3   omeprazole (PRILOSEC) 20 MG capsule Take 1 capsule (20 mg total) by mouth 2 (two) times daily before a meal. 180 capsule 3   polyethylene glycol (MIRALAX) 17 g packet Take 17 g by  mouth daily. Titrate up as needed 30 each 11   sertraline (ZOLOFT) 100 MG tablet Take 1 tablet (100 mg total) by mouth daily. 90 tablet 3   No current facility-administered medications for this visit.     Abtx:  Anti-infectives (From admission, onward)    None       REVIEW OF SYSTEMS:  Const: negative  fever, negative chills, negative weight loss Eyes: negative diplopia or visual changes, negative eye pain ENT: negative coryza, negative sore throat, has some nasal fullness/congestion- no nasal drip Resp: negative cough, hemoptysis, some chest tightness on exercise Cards: negative for chest pain, palpitations, lower extremity edema GU: negative for frequency, dysuria and hematuria GI: Negative for abdominal pain, diarrhea, bleeding, constipation Skin: negative for rash and pruritus Heme: negative for easy bruising and gum/nose bleeding MS: negative for myalgias, arthralgias, back pain and muscle weakness Neurolo:negative for headaches, dizziness, vertigo, memory problems  Psych: negative for feelings of anxiety, depression  Endocrine: Says she has hypoglycemia. Allergy/Immunology-as above ? Objective:  VITALS:  BP 125/82   Pulse 82   Resp 16   Ht 5\' 3"  (1.6 m)   Wt 131 lb (59.4 kg)   LMP 11/15/2020   SpO2 98%   BMI 23.21 kg/m  PHYSICAL EXAM:  General: alert, normal build, no distress Head: Normocephalic, without obvious abnormality, atraumatic. Eyes: Conjunctivae clear, anicteric sclerae. Pupils are equal ENT Nares normal. No drainage or sinus tenderness. Lips, mucosa, and tongue normal. No Thrush Neck: Supple, symmetrical, no adenopathy, thyroid: non tender no carotid bruit and no JVD. Back: No CVA tenderness. Lungs: Clear to auscultation bilaterally. No Wheezing or Rhonchi. No rales. Heart: Regular rate and rhythm, no murmur, rub or gallop. Abdomen: Soft, non-tender,not distended. Bowel sounds normal. No masses Extremities: atraumatic, no cyanosis. No edema. No  clubbing Skin: No rashes or lesions. Or bruising Lymph: Cervical, supraclavicular normal. Neurologic: Grossly non-focal Pertinent Labs Lab Results CBC    Component Value Date/Time   WBC 9.8 11/16/2020 0827   RBC 4.58 11/16/2020 0827   HGB 12.8 11/16/2020 0827   HCT 39.7 11/16/2020 0827   PLT 233 11/16/2020 0827   MCV 86.7 11/16/2020 0827   MCH 27.9 11/16/2020 0827   MCHC 32.2 11/16/2020 0827   RDW 12.7 11/16/2020 0827   LYMPHSABS 2.4 05/26/2020 0831   MONOABS 0.6 05/26/2020 0831   EOSABS 0.1 05/26/2020 0831   BASOSABS 0.0 05/26/2020 0831    CMP Latest Ref Rng & Units 11/16/2020 05/26/2020 05/20/2019  Glucose 70 - 99 mg/dL 95 88 92  BUN 6 - 20 mg/dL 14/06/2018) 15 14  Creatinine 0.44 - 1.00 mg/dL 11(H 4.17 4.08  Sodium 135 - 145 mmol/L 137 137 137  Potassium 3.5 - 5.1 mmol/L 4.4 4.5 4.0  Chloride 98 - 111 mmol/L 103 102 102  CO2 22 - 32 mmol/L 25 30 29   Calcium 8.9 - 10.3 mg/dL 9.2 9.5 9.4  Total Protein 6.5 - 8.1 g/dL 7.2 6.7 6.9  Total Bilirubin 0.3 - 1.2 mg/dL 1.44) 0.3 0.4  Alkaline Phos 38 - 126 U/L 42 48 48  AST 15 - 41 U/L 18 28 15   ALT 0 - 44 U/L 16 34 13   05/26/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 Impression/Recommendation ?Positive QuantiFERON gold. Patient  not high risk for testing as well as for progression. Negative chest x-ray PPD was negative. Hence was not treated Today she is here to repeat Quantiferon gold  and then decide on treatment if needed Discussed the options and side effects of rifampin and DDI  IBS- on bentayl, miralax, omeprazole Will let her know the plan once result is available

## 2020-12-14 NOTE — Patient Instructions (Signed)
Today we are repeating a quantiferon gold and if positive will start on rifampin

## 2020-12-17 LAB — QUANTIFERON-TB GOLD PLUS (RQFGPL)
QuantiFERON Mitogen Value: >10 IU/mL
QuantiFERON Nil Value: 0.05 IU/mL
QuantiFERON TB1 Ag Value: 1.7 IU/mL
QuantiFERON TB2 Ag Value: 1.45 IU/mL

## 2020-12-17 LAB — QUANTIFERON-TB GOLD PLUS: QuantiFERON-TB Gold Plus: POSITIVE — AB

## 2020-12-21 ENCOUNTER — Telehealth: Payer: Self-pay

## 2020-12-21 ENCOUNTER — Other Ambulatory Visit: Payer: Self-pay | Admitting: Infectious Diseases

## 2020-12-21 MED ORDER — RIFAMPIN 300 MG PO CAPS: 600.0000 mg | ORAL_CAPSULE | Freq: Every day | ORAL | 3 refills | Status: DC

## 2020-12-21 NOTE — Progress Notes (Signed)
Quantiferon gold repeat positive Will give rifampin 600mg  once a day for 4 months. Side-effects explained to her last visit- also taled about drug interactions. Labs in 1 month

## 2020-12-21 NOTE — Telephone Encounter (Signed)
Left message to call [facility] ID back regarding lab results. Need to advise her Quant.  Gold lab test was Positive for TB. Rifampin RX has been sent to [facility] on [address]. She also will need to return to [facility]  Lab in 1 month (01/21/2021) orders placed for repeat blood test. We will call her with results and the next steps after that blood work is resulted.Please relate that if she has any symptoms of severe nausea or severe vomiting or yellowing of the eyes, she should call us immediately. Dr. Rivka Safer did go over this extensively in last visit.

## 2021-01-16 ENCOUNTER — Other Ambulatory Visit: Payer: Self-pay | Admitting: Internal Medicine

## 2021-01-16 DIAGNOSIS — R109 Unspecified abdominal pain: Secondary | ICD-10-CM

## 2021-01-16 MED ORDER — DICYCLOMINE HCL 10 MG PO CAPS: 10.0000 mg | ORAL_CAPSULE | Freq: Three times a day (TID) | ORAL | 3 refills | Status: DC

## 2021-04-19 ENCOUNTER — Other Ambulatory Visit: Payer: Self-pay

## 2021-04-19 ENCOUNTER — Telehealth: Payer: Self-pay

## 2021-04-19 ENCOUNTER — Other Ambulatory Visit
Admission: RE | Admit: 2021-04-19 | Discharge: 2021-04-19 | Disposition: A | Discharge status: Home / Self Care | Payer: BC Managed Care – PPO | Attending: Infectious Diseases | Admitting: Infectious Diseases

## 2021-04-19 ENCOUNTER — Encounter: Payer: Self-pay | Admitting: Infectious Diseases

## 2021-04-19 ENCOUNTER — Ambulatory Visit: Payer: BC Managed Care – PPO | Attending: Infectious Diseases | Admitting: Infectious Diseases

## 2021-04-19 VITALS — BP 118/77 | HR 86 | Resp 16 | Ht 63.0 in | Wt 131.0 lb

## 2021-04-19 DIAGNOSIS — T148XXA Other injury of unspecified body region, initial encounter: Secondary | ICD-10-CM

## 2021-04-19 DIAGNOSIS — Z792 Long term (current) use of antibiotics: Secondary | ICD-10-CM | POA: Insufficient documentation

## 2021-04-19 DIAGNOSIS — Z888 Allergy status to other drugs, medicaments and biological substances status: Secondary | ICD-10-CM | POA: Diagnosis not present

## 2021-04-19 DIAGNOSIS — T887XXA Unspecified adverse effect of drug or medicament, initial encounter: Secondary | ICD-10-CM | POA: Diagnosis not present

## 2021-04-19 DIAGNOSIS — Z8616 Personal history of COVID-19: Secondary | ICD-10-CM | POA: Insufficient documentation

## 2021-04-19 DIAGNOSIS — R5383 Other fatigue: Secondary | ICD-10-CM | POA: Diagnosis not present

## 2021-04-19 DIAGNOSIS — Z882 Allergy status to sulfonamides status: Secondary | ICD-10-CM | POA: Insufficient documentation

## 2021-04-19 DIAGNOSIS — Z793 Long term (current) use of hormonal contraceptives: Secondary | ICD-10-CM | POA: Diagnosis not present

## 2021-04-19 DIAGNOSIS — R7612 Nonspecific reaction to cell mediated immunity measurement of gamma interferon antigen response without active tuberculosis: Secondary | ICD-10-CM

## 2021-04-19 DIAGNOSIS — K589 Irritable bowel syndrome without diarrhea: Secondary | ICD-10-CM | POA: Diagnosis not present

## 2021-04-19 DIAGNOSIS — T50905A Adverse effect of unspecified drugs, medicaments and biological substances, initial encounter: Secondary | ICD-10-CM | POA: Insufficient documentation

## 2021-04-19 DIAGNOSIS — Z881 Allergy status to other antibiotic agents status: Secondary | ICD-10-CM | POA: Insufficient documentation

## 2021-04-19 DIAGNOSIS — Z79899 Other long term (current) drug therapy: Secondary | ICD-10-CM | POA: Insufficient documentation

## 2021-04-19 LAB — COMPREHENSIVE METABOLIC PANEL
ALT: 17 U/L (ref 0–44)
AST: 16 U/L (ref 15–41)
Albumin: 4.6 g/dL (ref 3.5–5.0)
Alkaline Phosphatase: 39 U/L (ref 38–126)
Anion gap: 6 (ref 5–15)
BUN: 14 mg/dL (ref 6–20)
CO2: 28 mmol/L (ref 22–32)
Calcium: 9.3 mg/dL (ref 8.9–10.3)
Chloride: 104 mmol/L (ref 98–111)
Creatinine, Ser: 0.77 mg/dL (ref 0.44–1.00)
GFR, Estimated: >60 mL/min (ref >60)
Glucose, Bld: 97 mg/dL (ref 70–99)
Potassium: 4.3 mmol/L (ref 3.5–5.1)
Sodium: 138 mmol/L (ref 135–145)
Total Bilirubin: 0.4 mg/dL (ref 0.3–1.2)
Total Protein: 7.6 g/dL (ref 6.5–8.1)

## 2021-04-19 LAB — CBC WITH DIFFERENTIAL/PLATELET
Abs Immature Granulocytes: 0.02 10*3/uL (ref 0.00–0.07)
Basophils Absolute: 0 10*3/uL (ref 0.0–0.1)
Basophils Relative: 1 %
Eosinophils Absolute: 0.1 10*3/uL (ref 0.0–0.5)
Eosinophils Relative: 2 %
HCT: 37.6 % (ref 36.0–46.0)
Hemoglobin: 12.8 g/dL (ref 12.0–15.0)
Immature Granulocytes: 0 %
Lymphocytes Relative: 36 %
Lymphs Abs: 2.2 10*3/uL (ref 0.7–4.0)
MCH: 29 pg (ref 26.0–34.0)
MCHC: 34 g/dL (ref 30.0–36.0)
MCV: 85.3 fL (ref 80.0–100.0)
Monocytes Absolute: 0.4 10*3/uL (ref 0.1–1.0)
Monocytes Relative: 7 %
Neutro Abs: 3.4 10*3/uL (ref 1.7–7.7)
Neutrophils Relative %: 54 %
Platelets: 239 10*3/uL (ref 150–400)
RBC: 4.41 MIL/uL (ref 3.87–5.11)
RDW: 12.5 % (ref 11.5–15.5)
WBC: 6.1 10*3/uL (ref 4.0–10.5)
nRBC: 0 % (ref 0.0–0.2)

## 2021-04-19 LAB — SEDIMENTATION RATE: Sed Rate: 7 mm/hr (ref 0–20)

## 2021-04-19 LAB — C-REACTIVE PROTEIN: CRP: 0.5 mg/dL (ref <1.0)

## 2021-04-19 NOTE — Progress Notes (Signed)
NAME: Veronica [name]  DOB: Nov 05, 1975  MRN: [id number]  Date/Time: 04/19/2021 11:19 AM   Subjective:    ? Veronica [name] is a 45 y.o. with a history of positive quantiferon gold, IBS Is here to see me for bruising  Pt on rifampin for latent TB since 12/22/20 She has been adherent to meds She noted a red spot on her left middle finger last Friday without any preceding trauma and it became a bruise and disappeared in 72 hrs- no pain or itching   She also saw some bruises rt leg She was worried whether it was a side effect from Rifampin Feeling tired No fever No sore throat No icterus No loss of appetite No pain abdomen No heavy periods Did not notice any blood in stool, urine Orange colored urine due to rifampin  Past Medical History:  Diagnosis Date   Allergy    cedar, tree, dogs, cats    Anxiety 06/06/2016   Asthma    allergic    COVID-19 06/23/2020   all symptoms x 2 weeks all symptoms resolved per pt   Depression    False positive purified protein derivative (PPD) test 05-2021   chest xray 06-11-2021 normal epic   GERD (gastroesophageal reflux disease)    Hyperlipidemia    Hypoglycemia    IBS (irritable bowel syndrome)    S/P tubal ligation 11/19/2020   Spina bifida ([place])    caused djd lower back per pt    Past Surgical History:  Procedure Laterality Date   colonscopy  summer 2021   DILATION AND CURETTAGE OF UTERUS  06/13/2002   LAPAROSCOPIC BILATERAL SALPINGECTOMY Bilateral 11/19/2020   Procedure: LAPAROSCOPIC BILATERAL SALPINGECTOMY;  Surgeon: Janyth Contes, MD;  Location: [place];  Service: Gynecology;  Laterality: Bilateral;   vocal cord surgery  2008   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 , no issues since    Social History   Socioeconomic History   Marital status: Divorced    Spouse name: Not on file   Number of children: 2   Years of education: Not on file   Highest education level: Not on file   Occupational History   Occupation: Teacher  Tobacco Use   Smoking status: Never   Smokeless tobacco: Never  Vaping Use   Vaping Use: Never used  Substance and Sexual Activity   Alcohol use: Yes    Alcohol/week: 2.0 standard drinks    Types: 2 Standard drinks or equivalent per week    Comment: social   Drug use: No   Sexual activity: Yes    Partners: Male  Other Topics Concern   Not on file  Social History Narrative   Married ? 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    Social Determinants of Health   Financial Resource Strain: Not on file  Food Insecurity: Not on file  Transportation Needs: Not on file  Physical Activity: Not on file  Stress: Not on file  Social Connections: Not on file  Intimate Partner Violence: Not on file    Family History  Problem Relation Age of Onset   Hyperlipidemia Mother    Heart disease Father        heart valve issues s/p heart surgery    Hypertension Father    Diabetes Father    Colon polyps Father  precancerous   Stroke Father    Arthritis Maternal Grandmother    Stroke Maternal Grandmother    Hyperlipidemia Maternal Grandmother    Hypertension Maternal Grandfather    Colon cancer Paternal Grandmother        dx age 70 y.o    Hypertension Paternal Grandfather    Diabetes Paternal Grandfather    Endometriosis Sister    Hypertension Brother    Diabetes Brother    Colon polyps Paternal Aunt    Breast cancer Neg Hx    Allergies  Allergen Reactions   Doxycycline Nausea And Vomiting   Pseudoephedrine Hcl Palpitations    Sudafed increased heart rate and breathing gets shallow   Sulfa Antibiotics Rash   I? Current Outpatient Medications  Medication Sig Dispense Refill   albuterol (VENTOLIN HFA) 108 (90 Base) MCG/ACT inhaler Inhale 1-2 puffs into the lungs every 6 (six) hours as needed for wheezing or shortness of breath. 18 g 11   buPROPion (WELLBUTRIN XL) 150  MG 24 hr tablet Take 1 tablet (150 mg total) by mouth daily. 90 tablet 3   dicyclomine (BENTYL) 10 MG capsule Take 1 capsule (10 mg total) by mouth 3 (three) times daily before meals. (Patient taking differently: Take 10 mg by mouth in the morning and at bedtime.) 270 capsule 3   fluticasone (FLONASE) 50 MCG/ACT nasal spray Place 2 sprays into both nostrils daily as needed for allergies or rhinitis. 16 g 11   levonorgestrel (MIRENA) 20 MCG/24HR IUD 1 each by Intrauterine route once.     LORazepam (ATIVAN) 0.5 MG tablet 1/2 pill at night as needed prn anxiety 30 tablet 2   omeprazole (PRILOSEC) 20 MG capsule Take 1 capsule (20 mg total) by mouth 2 (two) times daily before a meal. 180 capsule 3   rifampin (RIFADIN) 300 MG capsule Take 2 capsules (600 mg total) by mouth daily. 60 capsule 3   sertraline (ZOLOFT) 100 MG tablet Take 1 tablet (100 mg total) by mouth daily. 90 tablet 3   montelukast (SINGULAIR) 10 MG tablet Take 1 tablet (10 mg total) by mouth at bedtime. (Patient not taking: Reported on 04/19/2021) 90 tablet 3   No current facility-administered medications for this visit.     Abtx:  Anti-infectives (From admission, onward)    None       REVIEW OF SYSTEMS:  Const: negative fever, negative chills, negative weight loss Eyes: negative diplopia or visual changes, negative eye pain ENT: negative coryza, negative sore throat Resp: negative cough, hemoptysis, dyspnea Cards: negative for chest pain, palpitations, lower extremity edema GU: negative for frequency, dysuria and hematuria GI: Negative for abdominal pain, diarrhea, bleeding, constipation Skin: negative for rash and pruritus Heme: negative for easy bruising and gum/nose bleeding MS: rt elbow pain,body aches  Neurolo:negative for headaches, dizziness, vertigo, memory problems  Psych:depression Endocrine: negative for thyroid, diabetes Allergy/Immunology- as above  Objective:  VITALS:  BP 118/77   Pulse 86   Resp 16    Ht _0  (1.6 m)   Wt 131 lb (59.4 kg)   SpO2 98%   BMI 23.21 kg/m  PHYSICAL EXAM:  General: Alert, cooperative, no distress, appears stated age.  Head: Normocephalic, without obvious abnormality, atraumatic. Eyes: Conjunctivae clear, anicteric sclerae. Pupils are equal ENT Nares normal. No drainage or sinus tenderness. Lips, mucosa, and tongue normal. No Thrush Neck: Supple, symmetrical, no adenopathy, thyroid: non tender no carotid bruit and no JVD. Back: No CVA tenderness. Lungs: Clear to auscultation bilaterally. No Wheezing or  Rhonchi. No rales. Heart: Regular rate and rhythm, no murmur, rub or gallop. Abdomen: Soft, non-tender,not distended. Bowel sounds normal. No masses Extremities: rt leg- prominent veins-   Left hand- erythema on the middle finger resolved Skin: No rashes or lesions. Or bruising Lymph: Cervical, supraclavicular normal. Neurologic: Grossly non-focal Pertinent Labs None      ? Impression/Recommendation ? 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 ___________________________________________________ Discussed with patient in great detail  Addendum CBC    Component Value Date/Time   WBC 6.1 04/19/2021 1138   RBC 4.41 04/19/2021 1138   HGB 12.8 04/19/2021 1138   HCT 37.6 04/19/2021 1138   PLT 239 04/19/2021 1138   MCV 85.3 04/19/2021 1138   MCH 29.0 04/19/2021 1138   MCHC 34.0 04/19/2021 1138   RDW 12.5 04/19/2021 1138   LYMPHSABS  2.2 04/19/2021 1138   MONOABS 0.4 04/19/2021 1138   EOSABS 0.1 04/19/2021 1138   BASOSABS 0.0 04/19/2021 1138    CMP     Component Value Date/Time   NA 138 04/19/2021 1138   K 4.3 04/19/2021 1138   CL 104 04/19/2021 1138   CO2 28 04/19/2021 1138   GLUCOSE 97 04/19/2021 1138   BUN 14 04/19/2021 1138   CREATININE 0.77 04/19/2021 1138   CREATININE 0.65 12/04/2017 0823   CALCIUM 9.3 04/19/2021 1138   PROT 7.6 04/19/2021 1138   ALBUMIN 4.6 04/19/2021 1138   AST 16 04/19/2021 1138   ALT 17 04/19/2021 1138   ALKPHOS 39 04/19/2021 1138   BILITOT 0.4 04/19/2021 1138   GFRNONAA >60 04/19/2021 1138   ESR 7 CRP pending Will let patient lnow to restart rifampin as she has normal plt and other labs

## 2021-04-19 NOTE — Telephone Encounter (Signed)
Called to relay lab results. Left VM and sent mychart:  Labs are perfect and Dr R does not think Rifampin is to blame for the issues seen for today. She is instructing patient to finish Rifampin. Also wants her to DC Omeprazole as it will not be effective while on Rifampin and will be wasted.

## 2021-04-19 NOTE — Patient Instructions (Signed)
You are ehre forf an urgent visit as you have had couple of bruises on your body since being on rifampin - this is your 4 th month of rifampin- you feel tired and sleepy- with some aches- but no icterus, no fever or rash Willget labs today Please hold the rifampin until the labs result

## 2021-05-27 ENCOUNTER — Other Ambulatory Visit: Payer: Self-pay | Admitting: Internal Medicine

## 2021-05-27 ENCOUNTER — Encounter: Payer: BC Managed Care – PPO | Admitting: Internal Medicine

## 2021-05-27 ENCOUNTER — Telehealth: Payer: Self-pay | Admitting: Internal Medicine

## 2021-05-27 DIAGNOSIS — J309 Allergic rhinitis, unspecified: Secondary | ICD-10-CM

## 2021-05-27 NOTE — Telephone Encounter (Signed)
Patient no-showed today's appointment; appointment was for 05/27/21, provider notified for review of record. Letter sent for patient to call in and re-schedule.   

## 2021-05-29 ENCOUNTER — Other Ambulatory Visit: Payer: Self-pay | Admitting: Internal Medicine

## 2021-05-29 DIAGNOSIS — R109 Unspecified abdominal pain: Secondary | ICD-10-CM

## 2021-05-29 DIAGNOSIS — K219 Gastro-esophageal reflux disease without esophagitis: Secondary | ICD-10-CM

## 2021-06-07 ENCOUNTER — Other Ambulatory Visit: Payer: Self-pay | Admitting: Internal Medicine

## 2021-06-07 DIAGNOSIS — F419 Anxiety disorder, unspecified: Secondary | ICD-10-CM

## 2021-06-18 ENCOUNTER — Telehealth: Payer: Self-pay | Admitting: Internal Medicine

## 2021-06-18 DIAGNOSIS — F419 Anxiety disorder, unspecified: Secondary | ICD-10-CM

## 2021-06-18 DIAGNOSIS — F32A Depression, unspecified: Secondary | ICD-10-CM

## 2021-06-21 NOTE — Telephone Encounter (Signed)
LMTCB. Need to schedule pt for a follow up. Not been since 05/26/2020.

## 2021-06-23 ENCOUNTER — Other Ambulatory Visit: Payer: Self-pay | Admitting: Internal Medicine

## 2021-06-23 DIAGNOSIS — F32A Depression, unspecified: Secondary | ICD-10-CM

## 2021-06-23 MED ORDER — BUPROPION HCL ER (XL) 150 MG PO TB24: 150.0000 mg | ORAL_TABLET | Freq: Every day | ORAL | 0 refills | Status: DC

## 2021-06-23 NOTE — Telephone Encounter (Signed)
Pt scheduled an appt for 06/30/2021 is it okay to refill medication for 30 days only?

## 2021-06-23 NOTE — Telephone Encounter (Signed)
Pt scheduled for 1/12 at 11:30

## 2021-06-30 ENCOUNTER — Other Ambulatory Visit: Payer: Self-pay

## 2021-06-30 ENCOUNTER — Encounter: Payer: Self-pay | Admitting: Internal Medicine

## 2021-06-30 ENCOUNTER — Ambulatory Visit (INDEPENDENT_AMBULATORY_CARE_PROVIDER_SITE_OTHER): Payer: BC Managed Care – PPO | Admitting: Internal Medicine

## 2021-06-30 VITALS — BP 122/78 | HR 98 | Temp 97.6°F | Ht 63.0 in | Wt 133.2 lb

## 2021-06-30 DIAGNOSIS — F32A Depression, unspecified: Secondary | ICD-10-CM

## 2021-06-30 DIAGNOSIS — Z1389 Encounter for screening for other disorder: Secondary | ICD-10-CM

## 2021-06-30 DIAGNOSIS — Z0000 Encounter for general adult medical examination without abnormal findings: Secondary | ICD-10-CM

## 2021-06-30 DIAGNOSIS — E611 Iron deficiency: Secondary | ICD-10-CM

## 2021-06-30 DIAGNOSIS — Z23 Encounter for immunization: Secondary | ICD-10-CM

## 2021-06-30 DIAGNOSIS — Z1329 Encounter for screening for other suspected endocrine disorder: Secondary | ICD-10-CM

## 2021-06-30 DIAGNOSIS — F419 Anxiety disorder, unspecified: Secondary | ICD-10-CM | POA: Diagnosis not present

## 2021-06-30 DIAGNOSIS — R5383 Other fatigue: Secondary | ICD-10-CM

## 2021-06-30 DIAGNOSIS — E538 Deficiency of other specified B group vitamins: Secondary | ICD-10-CM

## 2021-06-30 DIAGNOSIS — E559 Vitamin D deficiency, unspecified: Secondary | ICD-10-CM

## 2021-06-30 DIAGNOSIS — F410 Panic disorder [episodic paroxysmal anxiety] without agoraphobia: Secondary | ICD-10-CM

## 2021-06-30 DIAGNOSIS — E785 Hyperlipidemia, unspecified: Secondary | ICD-10-CM

## 2021-06-30 MED ORDER — LORAZEPAM 0.5 MG PO TABS: ORAL_TABLET | ORAL | 2 refills | Status: DC

## 2021-06-30 MED ORDER — BUPROPION HCL ER (XL) 150 MG PO TB24: 150.0000 mg | ORAL_TABLET | Freq: Every day | ORAL | 3 refills | Status: DC

## 2021-06-30 NOTE — Patient Instructions (Addendum)
Coppertone compression knee [facility] Consider pap smear every 3-5 years if not had with Dr. Ellyn Hack   Consider hep B vaccine new x 2 doses 1 month apart  Consider prevnar vaccine here or pharmacy   Consider 4 total covid 19 shots   Coronary Calcium Scan A coronary calcium scan is an imaging test used to look for deposits of plaque in the inner lining of the blood vessels of the heart (coronary arteries). Plaque is made up of calcium, protein, and fatty substances. These deposits of plaque can partly clog and narrow the coronary arteries without producing any symptoms or warning signs. This puts a person at risk for a heart attack. This test is recommended for people who are at moderate risk for heart disease. The test can find plaque deposits before symptoms develop. Tell a health care provider about: Any allergies you have. All medicines you are taking, including vitamins, herbs, eye drops, creams, and over-the-counter medicines. Any problems you or family members have had with anesthetic medicines. Any blood disorders you have. Any surgeries you have had. Any medical conditions you have. Whether you are pregnant or may be pregnant. What are the risks? Generally, this is a safe procedure. However, problems may occur, including: Harm to a pregnant woman and her unborn baby. This test involves the use of radiation. Radiation exposure can be dangerous to a pregnant woman and her unborn baby. If you are pregnant or think you may be pregnant, you should not have this procedure done. Slight increase in the risk of cancer. This is because of the radiation involved in the test. What happens before the procedure? Ask your health care provider for any specific instructions on how to prepare for this procedure. You may be asked to avoid products that contain caffeine, tobacco, or nicotine for 4 hours before the procedure. What happens during the procedure?  You will undress and remove any  jewelry from your neck or chest. You will put on a hospital gown. Sticky electrodes will be placed on your chest. The electrodes will be connected to an electrocardiogram (ECG) machine to record a tracing of the electrical activity of your heart. You will lie down on a curved bed that is attached to the CT scanner. You may be given medicine to slow down your heart rate so that clear pictures can be created. You will be moved into the CT scanner, and the CT scanner will take pictures of your heart. During this time, you will be asked to lie still and hold your breath for 2-3 seconds at a time while each picture of your heart is being taken. The procedure may vary among health care providers and hospitals. What happens after the procedure? You can get dressed. You can return to your normal activities. It is up to you to get the results of your procedure. Ask your health care provider, or the department that is doing the procedure, when your results will be ready. Summary A coronary calcium scan is an imaging test used to look for deposits of plaque in the inner lining of the blood vessels of the heart (coronary arteries). Plaque is made up of calcium, protein, and fatty substances. Generally, this is a safe procedure. Tell your health care provider if you are pregnant or may be pregnant. Ask your health care provider for any specific instructions on how to prepare for this procedure. A CT scanner will take pictures of your heart. You can return to your normal activities after the  scan is done. This information is not intended to replace advice given to you by your health care provider. Make sure you discuss any questions you have with your health care provider. Document Revised: 12/19/2018 Document Reviewed: 12/24/2018 Elsevier Patient Education  2022 Elsevier Inc.  Varicose Veins Varicose veins are veins that have become enlarged, bulged, and twisted. They most often appear in the legs. What  are the causes? This condition is caused by damage to the valves in the vein. These valves help blood return to your heart. When they are damaged and they stop working properly, blood may flow backward and back up in the veins near the skin, causing the veins to get larger and appear twisted. The condition can result from any issue that causes blood to back up, like pregnancy, prolonged standing, or obesity. What increases the risk? The following factors may make you more likely to develop this condition: Being on your feet a lot. Being pregnant. Being overweight. Smoking. Having had a previous deep vein thrombosis or having a thrombotic disorder. Aging. The risk increases with age. Having a condition called Klippel-Trenaunay syndrome. What are the signs or symptoms? Symptoms of this condition include: Bulging, twisted, and bluish veins. A feeling of heaviness in your legs. This may be worse at the end of the day. Leg pain. This may be worse at the end of the day. Swelling in the leg. Changes in skin color over the veins. Swelling or pain in the legs can limit your activities. Your symptoms may get worse when you sit or stand for long periods of time. How is this diagnosed? This condition may be diagnosed based on: Your symptoms, family history, activity levels, and lifestyle. A physical exam. You may also have tests, including an ultrasound or X-ray. How is this treated? Treatment for this condition may involve: Avoiding sitting or standing in one position for long periods of time. Wearing compression stockings. These stockings help to prevent blood clots and reduce swelling in the legs. Raising (elevating) the legs when resting. Losing weight. Exercising regularly. If you have persistent symptoms or want to improve the way your varicose veins look, you may choose to have a procedure to close the varicose veins off or to remove them. Nonsurgical treatments to close off the veins  include: Sclerotherapy. In this treatment, a solution is injected into a vein to close it off. Laser treatment. The vein is heated with a laser to close it off. Radiofrequency vein ablation. An electrical current produced by radio waves is used to close off the vein. Surgical treatments to remove the veins include: Phlebectomy. In this procedure, the veins are removed through small incisions made over the veins. Vein ligation and stripping. In this procedure, incisions are made over the veins. The veins are then removed after being tied (ligated) with stitches (sutures). Follow these instructions at home: Medicines Take over-the-counter and prescription medicines only as told by your health care provider. If you were prescribed an antibiotic medicine, use it as told by your health care provider. Do not stop using the antibiotic even if you start to feel better. Activity Walk as much as possible. Walking increases blood flow. This helps blood return to the heart and takes pressure off your veins. Do not stand or sit in one position for a long period of time. Do not sit with your legs crossed. Avoid sitting for a long time without moving. Get up to take short walks every 1-2 hours. This is important to improve  blood flow and breathing. Ask for help if you feel weak or unsteady. Return to your normal activities as told by your health care provider. Ask your health care provider what activities are safe for you. Do exercises as told by your health care provider. General instructions  Follow any diet instructions given to you by your health care provider. Elevate your legs at night to above the level of your heart. If you get a cut in the skin over the varicose vein and the vein bleeds: Lie down with your leg raised. Apply firm pressure to the cut with a clean cloth until the bleeding stops. Place a bandage (dressing) on the cut. Drink enough fluid to keep your urine pale yellow. Do not use any  products that contain nicotine or tobacco. These products include cigarettes, chewing tobacco, and vaping devices, such as e-cigarettes. If you need help quitting, ask your health care provider. Wear compression stockings as told by your health care provider. Do not wear other kinds of tight clothing around your legs, pelvis, or waist. Keep all follow-up visits. This is important. Contact a health care provider if: The skin around your varicose veins starts to break down. You have more pain, redness, tenderness, or hard swelling over a vein. You are uncomfortable because of pain. You get a cut in the skin over a varicose vein and it will not stop bleeding. Get help right away if: You have chest pain. You have trouble breathing. You have severe leg pain. Summary Varicose veins are veins that have become enlarged, bulged, and twisted. They most often appear in the legs. This condition is caused by damage to the valves in the vein. These valves help blood return to your heart. Treatment for this condition includes frequent movements, wearing compression stockings, losing weight, and exercising regularly. In some cases, procedures are done to close off or remove the veins. Nonsurgical treatments to close off the veins include sclerotherapy, laser therapy, and radiofrequency vein ablation. This information is not intended to replace advice given to you by your health care provider. Make sure you discuss any questions you have with your health care provider. Document Revised: 11/17/2020 Document Reviewed: 11/17/2020 Elsevier Patient Education  2022 Elsevier Inc.   Pneumococcal Conjugate Vaccine (Prevnar 13) Suspension for Injection What is this medication? PNEUMOCOCCAL VACCINE (NEU mo KOK al vak SEEN) is a vaccine used to prevent pneumococcus bacterial infections. These bacteria can cause serious infections like pneumonia, meningitis, and blood infections. This vaccine will lower your chance of  getting pneumonia. If you do get pneumonia, it can make your symptoms milder and your illness shorter. This vaccine will not treat an infection and will not cause infection. This vaccine is recommended for infants and young children, adults with certain medical conditions, and adults 65 years or older. This medicine may be used for other purposes; ask your health care provider or pharmacist if you have questions. COMMON BRAND NAME(S): Prevnar, Prevnar 13 What should I tell my care team before I take this medication? They need to know if you have any of these conditions: bleeding problems fever immune system problems an unusual or allergic reaction to pneumococcal vaccine, diphtheria toxoid, other vaccines, latex, other medicines, foods, dyes, or preservatives pregnant or trying to get pregnant breast-feeding How should I use this medication? This vaccine is for injection into a muscle. It is given by a health care professional. A copy of Vaccine Information Statements will be given before each vaccination. Read this sheet carefully each  time. The sheet may change frequently. Talk to your pediatrician regarding the use of this medicine in children. While this drug may be prescribed for children as young as 726 weeks old for selected conditions, precautions do apply. Overdosage: If you think you have taken too much of this medicine contact a poison control center or emergency room at once. NOTE: This medicine is only for you. Do not share this medicine with others. What if I miss a dose? It is important not to miss your dose. Call your doctor or health care professional if you are unable to keep an appointment. What may interact with this medication? medicines for cancer chemotherapy medicines that suppress your immune function steroid medicines like prednisone or cortisone This list may not describe all possible interactions. Give your health care provider a list of all the medicines, herbs,  non-prescription drugs, or dietary supplements you use. Also tell them if you smoke, drink alcohol, or use illegal drugs. Some items may interact with your medicine. What should I watch for while using this medication? Mild fever and pain should go away in 3 days or less. Report any unusual symptoms to your doctor or health care professional. What side effects may I notice from receiving this medication? Side effects that you should report to your doctor or health care professional as soon as possible: allergic reactions like skin rash, itching or hives, swelling of the face, lips, or tongue breathing problems confused fast or irregular heartbeat fever over 102 degrees F seizures unusual bleeding or bruising unusual muscle weakness Side effects that usually do not require medical attention (report to your doctor or health care professional if they continue or are bothersome): aches and pains diarrhea fever of 102 degrees F or less headache irritable loss of appetite pain, tender at site where injected trouble sleeping This list may not describe all possible side effects. Call your doctor for medical advice about side effects. You may report side effects to FDA at 1-800-FDA-1088. Where should I keep my medication? This does not apply. This vaccine is given in a clinic, pharmacy, doctor's office, or other health care setting and will not be stored at home. NOTE: This sheet is a summary. It may not cover all possible information. If you have questions about this medicine, talk to your doctor, pharmacist, or health care provider.  2022 Elsevier/Gold Standard (2014-03-12 00:00:00)

## 2021-06-30 NOTE — Progress Notes (Signed)
Chief Complaint  Patient presents with   Annual Exam   Annual  1. Had tubal in 11/2020 Dr. Melba Coon  2. C/o fatigue and bruising when she took Rifampin x 4 monmths 3. Colon cancer and polyps history p GM colon cancer, p aunt colon polyps, dad precancerous polyps  She may want genetic testing in the future  4. Anxiety/depression controlled on zoloft 100 mg qd, wellbutrin xl 150 mg qd  5. Hld disc ct score cardiac in the future FH heart disease pt will let me know if wants to do ct calcium score   Review of Systems  Constitutional:  Negative for weight loss.  HENT:  Negative for hearing loss.   Eyes:  Negative for blurred vision.  Respiratory:  Negative for shortness of breath.   Cardiovascular:  Negative for chest pain.  Gastrointestinal:  Negative for abdominal pain and blood in stool.  Genitourinary:  Negative for dysuria.  Musculoskeletal:  Negative for falls and joint pain.  Skin:  Negative for rash.  Neurological:  Negative for headaches.  Psychiatric/Behavioral:  Negative for depression.   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   S/P tubal ligation 11/19/2020   Spina bifida ([place])    caused djd lower back per pt   Past Surgical History:  Procedure Laterality Date   colonscopy  summer 2021   DILATION AND CURETTAGE OF UTERUS  06/13/2002   LAPAROSCOPIC BILATERAL SALPINGECTOMY Bilateral 11/19/2020   Procedure: LAPAROSCOPIC BILATERAL SALPINGECTOMY;  Surgeon: Janyth Contes, MD;  Location: [facility];  Service:  Gynecology;  Laterality: Bilateral;   vocal cord surgery  2008   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 , no issues since   Family History  Problem Relation Age of Onset   Hyperlipidemia Mother    Heart disease Father        heart valve issues s/p heart surgery    Hypertension Father    Diabetes Father    Colon polyps Father        precancerous   Stroke Father        in 58s   Heart attack Father    Endometriosis Sister    Hypertension Brother    Diabetes Brother    Arthritis Maternal Grandmother    Stroke Maternal Grandmother    Hyperlipidemia Maternal Grandmother    Hypertension Maternal Grandfather    Colon cancer Paternal Grandmother        dx age 28 y.o    Hypertension Paternal Grandfather    Diabetes Paternal Grandfather    Colon polyps Paternal Aunt    Breast cancer Neg Hx    Social History   Socioeconomic History   Marital status: Divorced    Spouse name: Not on file   Number of children: 2   Years of education: Not on file   Highest education level: Not on file  Occupational History   Occupation: Pharmacist, hospital  Tobacco  Use   Smoking status: Never   Smokeless tobacco: Never  Vaping Use   Vaping Use: Never used  Substance and Sexual Activity   Alcohol use: Yes    Alcohol/week: 2.0 standard drinks    Types: 2 Standard drinks or equivalent per week    Comment: social   Drug use: No   Sexual activity: Yes    Partners: Male  Other Topics Concern   Not on file  Social History Narrative   Married ? 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    Social Determinants of Health   Financial Resource Strain: Not on file  Food Insecurity: Not on file  Transportation Needs: Not on file  Physical Activity: Not on file  Stress: Not on file  Social Connections: Not on file  Intimate Partner Violence: Not on file   Current Meds  Medication Sig   albuterol (VENTOLIN HFA) 108 (90  Base) MCG/ACT inhaler Inhale 1-2 puffs into the lungs every 6 (six) hours as needed for wheezing or shortness of breath.   dicyclomine (BENTYL) 10 MG capsule Take 1 capsule (10 mg total) by mouth 3 (three) times daily before meals. (Patient taking differently: Take 10 mg by mouth in the morning and at bedtime.)   levonorgestrel (MIRENA) 20 MCG/24HR IUD 1 each by Intrauterine route once.   omeprazole (PRILOSEC) 20 MG capsule TAKE 1 CAPSULE(20 MG) BY MOUTH TWICE DAILY BEFORE A MEAL   sertraline (ZOLOFT) 100 MG tablet TAKE 1 TABLET(100 MG) BY MOUTH DAILY   [DISCONTINUED] buPROPion (WELLBUTRIN XL) 150 MG 24 hr tablet Take 1 tablet (150 mg total) by mouth daily. Appt further refills   [DISCONTINUED] LORazepam (ATIVAN) 0.5 MG tablet 1/2 pill at night as needed prn anxiety   Allergies  Allergen Reactions   Doxycycline Nausea And Vomiting   Pseudoephedrine Hcl Palpitations    Sudafed increased heart rate and breathing gets shallow   Sulfa Antibiotics Rash   Recent Results (from the past 2160 hour(s))  Sedimentation rate     Status: None   Collection Time: 04/19/21 11:38 AM  Result Value Ref Range   Sed Rate 7 0 - 20 mm/hr    Comment: Performed at [facility], [address] [place], [place] [address]  C-reactive protein     Status: None   Collection Time: 04/19/21 11:38 AM  Result Value Ref Range   CRP 0.5 <1.0 mg/dL    Comment: Performed at [facility] [address] [place],  [address]  Comprehensive metabolic panel     Status: None   Collection Time: 04/19/21 11:38 AM  Result Value Ref Range   Sodium 138 135 - 145 mmol/L   Potassium 4.3 3.5 - 5.1 mmol/L   Chloride 104 98 - 111 mmol/L   CO2 28 22 - 32 mmol/L   Glucose, Bld 97 70 - 99 mg/dL    Comment: Glucose reference range applies only to samples taken after fasting for at least 8 hours.   BUN 14 6 - 20 mg/dL   Creatinine, Ser 0.77 0.44 - 1.00 mg/dL   Calcium 9.3 8.9 - 10.3 mg/dL   Total Protein 7.6 6.5 -  8.1 g/dL   Albumin 4.6 3.5 - 5.0 g/dL   AST 16 15 - 41 U/L   ALT 17 0 - 44 U/L   Alkaline Phosphatase 39 38 - 126 U/L  Total Bilirubin 0.4 0.3 - 1.2 mg/dL   GFR, Estimated >60 >60 mL/min    Comment: (NOTE) Calculated using the CKD-EPI Creatinine Equation (2021)    Anion gap 6 5 - 15    Comment: Performed at [facility], [address] [place], [place] [address]  CBC with Differential/Platelet     Status: None   Collection Time: 04/19/21 11:38 AM  Result Value Ref Range   WBC 6.1 4.0 - 10.5 K/uL   RBC 4.41 3.87 - 5.11 MIL/uL   Hemoglobin 12.8 12.0 - 15.0 g/dL   HCT 37.6 36.0 - 46.0 %   MCV 85.3 80.0 - 100.0 fL   MCH 29.0 26.0 - 34.0 pg   MCHC 34.0 30.0 - 36.0 g/dL   RDW 12.5 11.5 - 15.5 %   Platelets 239 150 - 400 K/uL   nRBC 0.0 0.0 - 0.2 %   Neutrophils Relative % 54 %   Neutro Abs 3.4 1.7 - 7.7 K/uL   Lymphocytes Relative 36 %   Lymphs Abs 2.2 0.7 - 4.0 K/uL   Monocytes Relative 7 %   Monocytes Absolute 0.4 0.1 - 1.0 K/uL   Eosinophils Relative 2 %   Eosinophils Absolute 0.1 0.0 - 0.5 K/uL   Basophils Relative 1 %   Basophils Absolute 0.0 0.0 - 0.1 K/uL   Immature Granulocytes 0 %   Abs Immature Granulocytes 0.02 0.00 - 0.07 K/uL    Comment: Performed at [facility], [address] [place], [place] [address]   Objective  Body mass index is 23.6 kg/m. Wt Readings from Last 3 Encounters:  06/30/21 133 lb 3.2 oz (60.4 kg)  04/19/21 131 lb (59.4 kg)  12/14/20 131 lb (59.4 kg)   Temp Readings from Last 3 Encounters:  06/30/21 97.6 F (36.4 C) (Temporal)  11/19/20 97.6 F (36.4 C)  06/15/20 98.2 F (36.8 C) (Oral)   BP Readings from Last 3 Encounters:  06/30/21 122/78  04/19/21 118/77  12/14/20 125/82   Pulse Readings from Last 3 Encounters:  06/30/21 98  04/19/21 86  12/14/20 82    Physical Exam Vitals and nursing note reviewed.  Constitutional:      Appearance: Normal appearance. She is well-developed and well-groomed.   HENT:     Head: Normocephalic and atraumatic.  Eyes:     Conjunctiva/sclera: Conjunctivae normal.     Pupils: Pupils are equal, round, and reactive to light.  Cardiovascular:     Rate and Rhythm: Normal rate and regular rhythm.     Heart sounds: Normal heart sounds. No murmur heard. Pulmonary:     Effort: Pulmonary effort is normal.     Breath sounds: Normal breath sounds.  Abdominal:     General: Abdomen is flat. Bowel sounds are normal.     Tenderness: There is no abdominal tenderness.  Musculoskeletal:        General: No tenderness.  Skin:    General: Skin is warm and dry.  Neurological:     General: No focal deficit present.     Mental Status: She is alert and oriented to person, place, and time. Mental status is at baseline.     Cranial Nerves: Cranial nerves 2-12 are intact.     Gait: Gait is intact.  Psychiatric:        Attention and Perception: Attention and perception normal.        Mood and Affect: Mood and affect normal.        Speech: Speech normal.  Behavior: Behavior normal. Behavior is cooperative.        Thought Content: Thought content normal.        Cognition and Memory: Cognition and memory normal.        Judgment: Judgment normal.    Assessment  Plan  Annual physical exam Needs flu shot - Plan: Flu Vaccine QUAD 6+ mos PF IM (Fluarix Quad PF) See below   Anxiety - Plan: LORazepam (ATIVAN) 0.5 MG tablet Depression, unspecified depression type - Plan: LORazepam (ATIVAN) 0.5 MG tablet Panic attack - Plan: LORazepam (ATIVAN) 0.5 MG tablet Anxiety and depression - Plan: buPROPion (WELLBUTRIN XL) 150 MG 24 hr tablet Zoloft 100 mg qd   Fatigue, unspecified type - Plan: TSH, Urinalysis, Routine w reflex microscopic  Hyperlipidemia, unspecified hyperlipidemia type - Plan: Lipid panel Consider CT calcium score   HM Had flu shot given today Tdap had 05/13/14  covid 3/3 consider 4th dry 3/3 hep B vaccines not immune consider new x 2 doses   Consider prevnar MMR immune   Declines STD cehck    Get records pap and mammo [facility] Dr. Melba Coon pt will call and sch pap if due  ROI sent   Derm no issues    Colonoscopy had 07/01/19 [facility] rec[facility] dad with precancerous polpys and grandparent colon cancer (dads mom in 57s) FH precancerous polyp brother Age 76  -rec colonoscopy Q5 years with FH ? If genetic testing needed    rec healthy diet and exercise    Provider: Dr. Olivia Mackie McLean-Scocuzza-Internal Medicine

## 2021-07-06 ENCOUNTER — Encounter: Payer: Self-pay | Admitting: Internal Medicine

## 2021-07-06 ENCOUNTER — Telehealth: Payer: Self-pay | Admitting: Internal Medicine

## 2021-07-06 NOTE — Telephone Encounter (Signed)
The last mammogram Dr. Melba Coon did was from 12/09/19 and pap1/29/18 please call [facility] and scheduled follow up for mammogram and pap smear They just sent me records  Thank you

## 2021-07-07 NOTE — Telephone Encounter (Signed)
Mychart message sent for Patient to contact OBGYN

## 2021-07-22 ENCOUNTER — Other Ambulatory Visit: Payer: Self-pay

## 2021-07-22 ENCOUNTER — Other Ambulatory Visit (INDEPENDENT_AMBULATORY_CARE_PROVIDER_SITE_OTHER): Payer: BC Managed Care – PPO

## 2021-07-22 DIAGNOSIS — E538 Deficiency of other specified B group vitamins: Secondary | ICD-10-CM | POA: Diagnosis not present

## 2021-07-22 DIAGNOSIS — Z1389 Encounter for screening for other disorder: Secondary | ICD-10-CM

## 2021-07-22 DIAGNOSIS — E785 Hyperlipidemia, unspecified: Secondary | ICD-10-CM

## 2021-07-22 DIAGNOSIS — E611 Iron deficiency: Secondary | ICD-10-CM

## 2021-07-22 DIAGNOSIS — R5383 Other fatigue: Secondary | ICD-10-CM

## 2021-07-22 DIAGNOSIS — Z1329 Encounter for screening for other suspected endocrine disorder: Secondary | ICD-10-CM

## 2021-07-22 DIAGNOSIS — E559 Vitamin D deficiency, unspecified: Secondary | ICD-10-CM | POA: Diagnosis not present

## 2021-07-22 LAB — LIPID PANEL
Cholesterol: 254 mg/dL — ABNORMAL HIGH (ref 0–200)
HDL: 50.8 mg/dL (ref >39.00)
LDL Cholesterol: 191 mg/dL — ABNORMAL HIGH (ref 0–99)
NonHDL: 203.39
Total CHOL/HDL Ratio: 5
Triglycerides: 64 mg/dL (ref 0.0–149.0)
VLDL: 12.8 mg/dL (ref 0.0–40.0)

## 2021-07-22 LAB — IRON,TIBC AND FERRITIN PANEL
%SAT: 28 % (calc) (ref 16–45)
Ferritin: 63 ng/mL (ref 16–232)
Iron: 89 ug/dL (ref 40–190)
TIBC: 319 mcg/dL (calc) (ref 250–450)

## 2021-07-22 LAB — VITAMIN B12: Vitamin B-12: 215 pg/mL (ref 211–911)

## 2021-07-22 LAB — VITAMIN D 25 HYDROXY (VIT D DEFICIENCY, FRACTURES): VITD: 32.3 ng/mL (ref 30.00–100.00)

## 2021-07-22 LAB — TSH: TSH: 1.01 u[IU]/mL (ref 0.35–5.50)

## 2021-07-22 NOTE — Addendum Note (Signed)
Addended by: Warden Fillers on: 07/22/2021 05:09 PM   Modules accepted: Orders

## 2021-07-28 ENCOUNTER — Encounter: Payer: BC Managed Care – PPO | Admitting: Internal Medicine

## 2021-08-09 ENCOUNTER — Encounter: Payer: BC Managed Care – PPO | Admitting: Internal Medicine

## 2022-02-13 ENCOUNTER — Other Ambulatory Visit: Payer: Self-pay | Admitting: Internal Medicine

## 2022-02-13 DIAGNOSIS — F419 Anxiety disorder, unspecified: Secondary | ICD-10-CM

## 2022-02-13 DIAGNOSIS — R109 Unspecified abdominal pain: Secondary | ICD-10-CM

## 2022-02-13 DIAGNOSIS — K219 Gastro-esophageal reflux disease without esophagitis: Secondary | ICD-10-CM

## 2022-02-13 MED ORDER — DICYCLOMINE HCL 10 MG PO CAPS: 10.0000 mg | ORAL_CAPSULE | Freq: Three times a day (TID) | ORAL | 3 refills | Status: DC

## 2022-02-13 MED ORDER — OMEPRAZOLE 20 MG PO CPDR: DELAYED_RELEASE_CAPSULE | ORAL | 3 refills | Status: DC

## 2022-02-13 MED ORDER — SERTRALINE HCL 100 MG PO TABS: ORAL_TABLET | ORAL | 3 refills | Status: DC

## 2022-02-13 MED ORDER — BUPROPION HCL ER (XL) 150 MG PO TB24: 150.0000 mg | ORAL_TABLET | Freq: Every day | ORAL | 3 refills | Status: DC

## 2022-02-23 ENCOUNTER — Ambulatory Visit: Payer: BC Managed Care – PPO | Admitting: Internal Medicine

## 2022-02-23 ENCOUNTER — Encounter: Payer: Self-pay | Admitting: Internal Medicine

## 2022-02-23 VITALS — BP 120/80 | HR 105 | Temp 98.3°F | Ht 63.0 in | Wt 133.0 lb

## 2022-02-23 DIAGNOSIS — Z77120 Contact with and (suspected) exposure to mold (toxic): Secondary | ICD-10-CM

## 2022-02-23 DIAGNOSIS — F32A Depression, unspecified: Secondary | ICD-10-CM

## 2022-02-23 DIAGNOSIS — R0683 Snoring: Secondary | ICD-10-CM | POA: Insufficient documentation

## 2022-02-23 DIAGNOSIS — J4520 Mild intermittent asthma, uncomplicated: Secondary | ICD-10-CM | POA: Diagnosis not present

## 2022-02-23 DIAGNOSIS — G8929 Other chronic pain: Secondary | ICD-10-CM

## 2022-02-23 DIAGNOSIS — R5383 Other fatigue: Secondary | ICD-10-CM

## 2022-02-23 DIAGNOSIS — E785 Hyperlipidemia, unspecified: Secondary | ICD-10-CM

## 2022-02-23 DIAGNOSIS — Z1389 Encounter for screening for other disorder: Secondary | ICD-10-CM

## 2022-02-23 DIAGNOSIS — K5909 Other constipation: Secondary | ICD-10-CM

## 2022-02-23 DIAGNOSIS — F410 Panic disorder [episodic paroxysmal anxiety] without agoraphobia: Secondary | ICD-10-CM

## 2022-02-23 DIAGNOSIS — J45909 Unspecified asthma, uncomplicated: Secondary | ICD-10-CM | POA: Diagnosis not present

## 2022-02-23 DIAGNOSIS — Z23 Encounter for immunization: Secondary | ICD-10-CM

## 2022-02-23 DIAGNOSIS — E559 Vitamin D deficiency, unspecified: Secondary | ICD-10-CM

## 2022-02-23 DIAGNOSIS — R519 Headache, unspecified: Secondary | ICD-10-CM

## 2022-02-23 DIAGNOSIS — E538 Deficiency of other specified B group vitamins: Secondary | ICD-10-CM

## 2022-02-23 DIAGNOSIS — F419 Anxiety disorder, unspecified: Secondary | ICD-10-CM

## 2022-02-23 DIAGNOSIS — R232 Flushing: Secondary | ICD-10-CM

## 2022-02-23 MED ORDER — LORAZEPAM 0.5 MG PO TABS: ORAL_TABLET | ORAL | 5 refills | Status: DC

## 2022-02-23 MED ORDER — ALBUTEROL SULFATE HFA 108 (90 BASE) MCG/ACT IN AERS: 1.0000 | INHALATION_SPRAY | Freq: Four times a day (QID) | RESPIRATORY_TRACT | 11 refills | Status: DC | PRN

## 2022-02-23 NOTE — Patient Instructions (Addendum)
Repeat colonoscopy 06/2024  Align pre/probiotics  Warm prune juice Colace stool softner  Miralax  IB GUARD/FD GUARD  Consider linzess chronic constipation   Consider imitrex or maxalt for headaches in the future if needed   Ambereen or Estrovan  Avoid black cohosh     MD No Physician   Engineer, civil (consulting) Information  Phone Fax E-mail Address  [phone]  [address]   [address]   [place] [address]     Specialties     Pulmonary Disease      Low-FODMAP Eating Plan  FODMAP stands for fermentable oligosaccharides, disaccharides, monosaccharides, and polyols. These are sugars that are hard for some people to digest. A low-FODMAP eating plan may help some people who have irritable bowel syndrome (IBS) and certain other bowel (intestinal) diseases to manage their symptoms. This meal plan can be complicated to follow. Work with a diet and nutrition specialist (dietitian) to make a low-FODMAP eating plan that is right for you. A dietitian can help make sure that you get enough nutrition from this diet. What are tips for following this plan? Reading food labels Check labels for hidden FODMAPs such as: High-fructose syrup. Honey. Agave. Natural fruit flavors. Onion or garlic powder. Choose low-FODMAP foods that contain 3-4 grams of fiber per serving. Check food labels for serving sizes. Eat only one serving at a time to make sure FODMAP levels stay low. Shopping Shop with a list of foods that are recommended on this diet and make a meal plan. Meal planning Follow a low-FODMAP eating plan for up to 6 weeks, or as told by your health care provider or dietitian. To follow the eating plan: Eliminate high-FODMAP foods from your diet completely. Choose only low-FODMAP foods to eat. You will do this for 2-6 weeks. Gradually reintroduce high-FODMAP foods into your diet one at a time. Most people should wait a few days before introducing the next new high-FODMAP food into  their meal plan. Your dietitian can recommend how quickly you may reintroduce foods. Keep a daily record of what and how much you eat and drink. Make note of any symptoms that you have after eating. Review your daily record with a dietitian regularly to identify which foods you can eat and which foods you should avoid. General tips Drink enough fluid each day to keep your urine pale yellow. Avoid processed foods. These often have added sugar and may be high in FODMAPs. Avoid most dairy products, whole grains, and sweeteners. Work with a dietitian to make sure you get enough fiber in your diet. Avoid high FODMAP foods at meals to manage symptoms. Recommended foods Fruits Bananas, oranges, tangerines, lemons, limes, blueberries, raspberries, strawberries, grapes, cantaloupe, honeydew melon, kiwi, papaya, passion fruit, and pineapple. Limited amounts of dried cranberries, banana chips, and shredded coconut. Vegetables Eggplant, zucchini, cucumber, peppers, green beans, bean sprouts, lettuce, arugula, kale, Swiss chard, spinach, collard greens, bok choy, summer squash, potato, and tomato. Limited amounts of corn, carrot, and sweet potato. Green parts of scallions. Grains Gluten-free grains, such as rice, oats, buckwheat, quinoa, corn, polenta, and millet. Gluten-free pasta, bread, or cereal. Rice noodles. Corn tortillas. Meats and other proteins Unseasoned beef, pork, poultry, or fish. Eggs. Tomasa Blase. Tofu (firm) and tempeh. Limited amounts of nuts and seeds, such as almonds, walnuts, [place] nuts, pecans, peanuts, nut butters, pumpkin seeds, chia seeds, and sunflower seeds. Dairy Lactose-free milk, yogurt, and kefir. Lactose-free cottage cheese and ice cream. Non-dairy milks, such as almond, coconut, hemp, and rice  milk. Non-dairy yogurt. Limited amounts of goat cheese, brie, mozzarella, parmesan, swiss, and other hard cheeses. Fats and oils Butter-free spreads. Vegetable oils, such as olive, canola,  and sunflower oil. Seasoning and other foods Artificial sweeteners with names that do not end in "ol," such as aspartame, saccharine, and stevia. Maple syrup, white table sugar, raw sugar, brown sugar, and molasses. Mayonnaise, soy sauce, and tamari. Fresh basil, coriander, parsley, rosemary, and thyme. Beverages Water and mineral water. Sugar-sweetened soft drinks. Small amounts of orange juice or cranberry juice. Black and green tea. Most dry wines. Coffee. The items listed above may not be a complete list of foods and beverages you can eat. Contact a dietitian for more information. Foods to avoid Fruits Fresh, dried, and juiced forms of apple, pear, watermelon, peach, plum, cherries, apricots, blackberries, boysenberries, figs, nectarines, and mango. Avocado. Vegetables Chicory root, artichoke, asparagus, cabbage, snow peas, Brussels sprouts, broccoli, sugar snap peas, mushrooms, celery, and cauliflower. Onions, garlic, leeks, and the white part of scallions. Grains Wheat, including kamut, durum, and semolina. Barley and bulgur. Couscous. Wheat-based cereals. Wheat noodles, bread, crackers, and pastries. Meats and other proteins Fried or fatty meat. Sausage. Cashews and pistachios. Soybeans, baked beans, black beans, chickpeas, kidney beans, fava beans, navy beans, lentils, black-eyed peas, and split peas. Dairy Milk, yogurt, ice cream, and soft cheese. Cream and sour cream. Milk-based sauces. Custard. Buttermilk. Soy milk. Seasoning and other foods Any sugar-free gum or candy. Foods that contain artificial sweeteners such as sorbitol, mannitol, isomalt, or xylitol. Foods that contain honey, high-fructose corn syrup, or agave. Bouillon, vegetable stock, beef stock, and chicken stock. Garlic and onion powder. Condiments made with onion, such as hummus, chutney, pickles, relish, salad dressing, and salsa. Tomato paste. Beverages Chicory-based drinks. Coffee substitutes. Chamomile tea. Fennel  tea. Sweet or fortified wines such as port or sherry. Diet soft drinks made with isomalt, mannitol, maltitol, sorbitol, or xylitol. Apple, pear, and mango juice. Juices with high-fructose corn syrup. The items listed above may not be a complete list of foods and beverages you should avoid. Contact a dietitian for more information. Summary FODMAP stands for fermentable oligosaccharides, disaccharides, monosaccharides, and polyols. These are sugars that are hard for some people to digest. A low-FODMAP eating plan is a short-term diet that helps to ease symptoms of certain bowel diseases. The eating plan usually lasts up to 6 weeks. After that, high-FODMAP foods are reintroduced gradually and one at a time. This can help you find out which foods may be causing symptoms. A low-FODMAP eating plan can be complicated. It is best to work with a dietitian who has experience with this type of plan. This information is not intended to replace advice given to you by your health care provider. Make sure you discuss any questions you have with your health care provider. Document Revised: 10/23/2019 Document Reviewed: 10/23/2019 Elsevier Patient Education  2023 Elsevier Inc.   Menopause Menopause is the normal time of a woman's life when menstrual periods stop completely. It marks the natural end to a woman's ability to become pregnant. It can be defined as the absence of a menstrual period for 12 months without another medical cause. The transition to menopause (perimenopause) most often happens between the ages of 33 and 87, and can last for many years. During perimenopause, hormone levels change in your body, which can cause symptoms and affect your health. Menopause may increase your risk for: Weakened bones (osteoporosis), which causes fractures. Depression. Hardening and narrowing of the arteries (atherosclerosis),  which can cause heart attacks and strokes. What are the causes? This condition is usually  caused by a natural change in hormone levels that happens as you get older. The condition may also be caused by changes that are not natural, including: Surgery to remove both ovaries (surgical menopause). Side effects from some medicines, such as chemotherapy used to treat cancer (chemical menopause). What increases the risk? This condition is more likely to start at an earlier age if you have certain medical conditions or have undergone treatments, including: A tumor of the pituitary gland in the brain. A disease that affects the ovaries and hormones. Certain cancer treatments, such as chemotherapy or hormone therapy, or radiation therapy on the pelvis. Heavy smoking and excessive alcohol use. Family history of early menopause. This condition is also more likely to develop earlier in women who are very thin. What are the signs or symptoms? Symptoms of this condition include: Hot flashes. Irregular menstrual periods. Night sweats. Changes in feelings about sex. This could be a decrease in sex drive or an increased discomfort around your sexuality. Vaginal dryness and thinning of the vaginal walls. This may cause painful sex. Dryness of the skin and development of wrinkles. Headaches. Problems sleeping (insomnia). Mood swings or irritability. Memory problems. Weight gain. Hair growth on the face and chest. Bladder infections or problems with urinating. How is this diagnosed? This condition is diagnosed based on your medical history, a physical exam, your age, your menstrual history, and your symptoms. Hormone tests may also be done. How is this treated? In some cases, no treatment is needed. You and your health care provider should make a decision together about whether treatment is necessary. Treatment will be based on your individual condition and preferences. Treatment for this condition focuses on managing symptoms. Treatment may include: Menopausal hormone therapy (MHT). Medicines  to treat specific symptoms or complications. Acupuncture. Vitamin or herbal supplements. Before starting treatment, make sure to let your health care provider know if you have a personal or family history of these conditions: Heart disease. Breast cancer. Blood clots. Diabetes. Osteoporosis. Follow these instructions at home: Lifestyle Do not use any products that contain nicotine or tobacco, such as cigarettes, e-cigarettes, and chewing tobacco. If you need help quitting, ask your health care provider. Get at least 30 minutes of physical activity on 5 or more days each week. Avoid alcoholic and caffeinated beverages, as well as spicy foods. This may help prevent hot flashes. Get 7-8 hours of sleep each night. If you have hot flashes, try: Dressing in layers. Avoiding things that may trigger hot flashes, such as spicy food, warm places, or stress. Taking slow, deep breaths when a hot flash starts. Keeping a fan in your home and office. Find ways to manage stress, such as deep breathing, meditation, or journaling. Consider going to group therapy with other women who are having menopause symptoms. Ask your health care provider about recommended group therapy meetings. Eating and drinking  Eat a healthy, balanced diet that contains whole grains, lean protein, low-fat dairy, and plenty of fruits and vegetables. Your health care provider may recommend adding more soy to your diet. Foods that contain soy include tofu, tempeh, and soy milk. Eat plenty of foods that contain calcium and vitamin D for bone health. Items that are rich in calcium include low-fat milk, yogurt, beans, almonds, sardines, broccoli, and kale. Medicines Take over-the-counter and prescription medicines only as told by your health care provider. Talk with your health care provider before  starting any herbal supplements. If prescribed, take vitamins and supplements as told by your health care provider. General  instructions  Keep track of your menstrual periods, including: When they occur. How heavy they are and how long they last. How much time passes between periods. Keep track of your symptoms, noting when they start, how often you have them, and how long they last. Use vaginal lubricants or moisturizers to help with vaginal dryness and improve comfort during sex. Keep all follow-up visits. This is important. This includes any group therapy or counseling. Contact a health care provider if: You are still having menstrual periods after age 84. You have pain during sex. You have not had a period for 12 months and you develop vaginal bleeding. Get help right away if you have: Severe depression. Excessive vaginal bleeding. Pain when you urinate. A fast or irregular heartbeat (palpitations). Severe headaches. Abdominal pain or severe indigestion. Summary Menopause is a normal time of life when menstrual periods stop completely. It is usually defined as the absence of a menstrual period for 12 months without another medical cause. The transition to menopause (perimenopause) most often happens between the ages of 16 and 103 and can last for several years. Symptoms can be managed through medicines, lifestyle changes, and complementary therapies such as acupuncture. Eat a balanced diet that is rich in nutrients to promote bone health and heart health and to manage symptoms during menopause. This information is not intended to replace advice given to you by your health care provider. Make sure you discuss any questions you have with your health care provider. Document Revised: 03/05/2020 Document Reviewed: 11/20/2019 Elsevier Patient Education  [employer].

## 2022-02-23 NOTE — Progress Notes (Signed)
Chief Complaint  Patient presents with   Toxic Inhalation    Exposure to mold at work   F/u  1. 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 C/o chronic constipation will try otc meds declines linzess for now and has seen GI for ab bloating   3?  Sinus issues disc consider referred to ENT [facility] in [place] she wonders if sinus issues causing h/a. No h/o migraines and she has had her vision checked recently and wears glasses  4. Hld changed diet and walking 2 miles per day   Review of Systems  Constitutional:  Positive for malaise/fatigue. Negative for weight loss.  HENT:  Negative for hearing loss.   Eyes:  Negative for blurred vision.  Respiratory:  Negative for shortness of breath.   Cardiovascular:  Negative for chest pain.  Gastrointestinal:  Negative for abdominal pain and blood in stool.  Genitourinary:  Negative for dysuria.  Musculoskeletal:  Negative for falls and joint pain.  Skin:  Negative for rash.  Neurological:  Positive for headaches.  Psychiatric/Behavioral:  Negative for depression.    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   Preeclampsia    with daughter   S/P tubal ligation 11/19/2020   Spina bifida ([place])    caused djd lower back per pt   Past Surgical History:  Procedure Laterality Date   colonscopy  summer 2021    DILATION AND CURETTAGE OF UTERUS  06/13/2002   LAPAROSCOPIC BILATERAL SALPINGECTOMY Bilateral 11/19/2020   Procedure: LAPAROSCOPIC BILATERAL SALPINGECTOMY;  Surgeon: Janyth Contes, MD;  Location: [facility];  Service: Gynecology;  Laterality: Bilateral;   vocal cord surgery  2008   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 , no issues since   Family History  Problem Relation Age of Onset   Hyperlipidemia Mother    Heart disease Father        heart valve issues s/p heart surgery    Hypertension Father    Diabetes Father    Colon polyps Father        precancerous   Stroke Father        in 21s   Heart attack Father    Endometriosis Sister    Hypertension Brother    Diabetes Brother    Arthritis Maternal Grandmother    Stroke Maternal Grandmother    Hyperlipidemia Maternal Grandmother    Hypertension Maternal Grandfather    Colon cancer Paternal Grandmother        dx age 35 y.o    Hypertension Paternal Grandfather    Diabetes Paternal Grandfather    Colon polyps Paternal Aunt    Breast cancer Neg Hx    Social History  Socioeconomic History   Marital status: Divorced    Spouse name: Not on file   Number of children: 2   Years of education: Not on file   Highest education level: Not on file  Occupational History   Occupation: Teacher  Tobacco Use   Smoking status: Never   Smokeless tobacco: Never  Vaping Use   Vaping Use: Never used  Substance and Sexual Activity   Alcohol use: Yes    Alcohol/week: 2.0 standard drinks of alcohol    Types: 2 Standard drinks or equivalent per week    Comment: social   Drug use: No   Sexual activity: Yes    Partners: Male  Other Topics Concern   Not on file  Social History Narrative   Married ? 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    Social Determinants of Health   Financial Resource Strain: Not on file  Food  Insecurity: Not on file  Transportation Needs: Not on file  Physical Activity: Not on file  Stress: Not on file  Social Connections: Not on file  Intimate Partner Violence: Not on file   Current Meds  Medication Sig   buPROPion (WELLBUTRIN XL) 150 MG 24 hr tablet Take 1 tablet (150 mg total) by mouth daily.   dicyclomine (BENTYL) 10 MG capsule Take 1 capsule (10 mg total) by mouth 4 (four) times daily -  before meals and at bedtime.   levonorgestrel (MIRENA) 20 MCG/24HR IUD 1 each by Intrauterine route once.   omeprazole (PRILOSEC) 20 MG capsule TAKE 1 CAPSULE(20 MG) BY MOUTH TWICE DAILY BEFORE A MEAL   sertraline (ZOLOFT) 100 MG tablet TAKE 1 TABLET(100 MG) BY MOUTH DAILY   [DISCONTINUED] albuterol (VENTOLIN HFA) 108 (90 Base) MCG/ACT inhaler Inhale 1-2 puffs into the lungs every 6 (six) hours as needed for wheezing or shortness of breath.   [DISCONTINUED] LORazepam (ATIVAN) 0.5 MG tablet 1/2 pill at night as needed prn anxiety   Allergies  Allergen Reactions   Doxycycline Nausea And Vomiting   Magnesium-Containing Compounds     Could not see, sob   Pseudoephedrine Hcl Palpitations    Sudafed increased heart rate and breathing gets shallow   Sulfa Antibiotics Rash   No results found for this or any previous visit (from the past 2160 hour(s)). Objective  Body mass index is 23.56 kg/m. Wt Readings from Last 3 Encounters:  02/23/22 133 lb (60.3 kg)  06/30/21 133 lb 3.2 oz (60.4 kg)  04/19/21 131 lb (59.4 kg)   Temp Readings from Last 3 Encounters:  02/23/22 98.3 F (36.8 C) (Oral)  06/30/21 97.6 F (36.4 C) (Temporal)  11/19/20 97.6 F (36.4 C)   BP Readings from Last 3 Encounters:  02/23/22 120/80  06/30/21 122/78  04/19/21 118/77   Pulse Readings from Last 3 Encounters:  02/23/22 (!) 105  06/30/21 98  04/19/21 86    Physical Exam Vitals and nursing note reviewed.  Constitutional:      Appearance: Normal appearance. She is well-developed and well-groomed.   HENT:     Head: Normocephalic and atraumatic.  Eyes:     Conjunctiva/sclera: Conjunctivae normal.     Pupils: Pupils are equal, round, and reactive to light.  Cardiovascular:     Rate and Rhythm: Normal rate and regular rhythm.     Heart sounds: Normal heart sounds. No murmur heard. Pulmonary:  Effort: Pulmonary effort is normal.     Breath sounds: Normal breath sounds.  Abdominal:     General: Abdomen is flat. Bowel sounds are normal.     Tenderness: There is no abdominal tenderness.  Musculoskeletal:        General: No tenderness.  Skin:    General: Skin is warm and dry.  Neurological:     General: No focal deficit present.     Mental Status: She is alert and oriented to person, place, and time. Mental status is at baseline.     Cranial Nerves: Cranial nerves 2-12 are intact.     Motor: Motor function is intact.     Coordination: Coordination is intact.     Gait: Gait is intact.  Psychiatric:        Attention and Perception: Attention and perception normal.        Mood and Affect: Mood and affect normal.        Speech: Speech normal.        Behavior: Behavior normal. Behavior is cooperative.        Thought Content: Thought content normal.        Cognition and Memory: Cognition and memory normal.        Judgment: Judgment normal.     Assessment  Plan  Mold exposure - Plan: Ambulatory referral to Pulmonology,   Mild intermittent asthma without complication - Plan: Ambulatory referral to Pulmonology,  Extrinsic asthma without complication, unspecified asthma severity, unspecified whether persistent - Plan: albuterol (VENTOLIN HFA) 108 (90 Base) MCG/ACT inhaler, Ambulatory referral to Pulmonology,   Hyperlipidemia, unspecified hyperlipidemia type - Plan: Lipid panel Rec healthy diet and exercise  Anxiety - Plan: LORazepam (ATIVAN) 0.5 MG tablet Zoloft 100 mg qd Depression, unspecified depression type - Plan: LORazepam (ATIVAN) 0.5 MG tablet Wellbutrin xl 150 mg  qd Panic attack - Plan: LORazepam (ATIVAN) 0.5 MG tablet  Chronic constipation Repeat colonoscopy 06/2024  Align pre/probiotics  Warm prune juice Colace stool softner  Miralax  IB GUARD/FD GUARD  Consider linzess chronic constipation  Chronic constipation declines linzess for now  Chronic intractable headache, unspecified headache type Consider imitrex or maxalt for headaches in the future if needed  Consider ENT [facility] in [place] to check sinuses out in the future pt will let me know  Eye exam normal   Snoring - Plan: Ambulatory referral to Pulmonology, consider home sleep study/sleep study  Hot flashes  Ambereen or Estrovan  Avoid black cohosh   HM Had flu shot given today Tdap had 05/13/14  covid 3/3 consider 4th dry 3/3 hep B vaccines not immune consider new x 2 doses  Consider prevnar MMR immune   Declines STD cehck    Get records pap and mammo [facility] Dr. Melba Coon pt will call and sch pap if due  03/2022 [facility]  11/2022 pap smear due    Derm no issues    Colonoscopy had 07/01/19 [facility] rec[facility] dad with precancerous polpys and grandparent colon cancer (dads mom in 17s) FH precancerous polyp brother Age 72  -rec colonoscopy Q5 years with FH ? If genetic testing needed due 06/2024    rec healthy diet and exercise  Provider: Dr. Olivia Mackie McLean-Scocuzza-Internal Medicine

## 2022-02-26 ENCOUNTER — Encounter: Payer: Self-pay | Admitting: Internal Medicine

## 2022-05-09 ENCOUNTER — Other Ambulatory Visit: Payer: BC Managed Care – PPO

## 2022-06-21 ENCOUNTER — Encounter: Payer: BC Managed Care – PPO | Admitting: Family Medicine

## 2022-11-26 ENCOUNTER — Ambulatory Visit
Admission: RE | Admit: 2022-11-26 | Discharge: 2022-11-26 | Disposition: A | Discharge status: Home / Self Care | Payer: BC Managed Care – PPO | Source: Ambulatory Visit

## 2022-11-26 VITALS — BP 132/87 | HR 88 | Temp 99.1°F | Resp 16

## 2022-11-26 DIAGNOSIS — N3001 Acute cystitis with hematuria: Secondary | ICD-10-CM | POA: Diagnosis not present

## 2022-11-26 DIAGNOSIS — R1032 Left lower quadrant pain: Secondary | ICD-10-CM

## 2022-11-26 LAB — POCT URINALYSIS DIP (MANUAL ENTRY)
Bilirubin, UA: NEGATIVE
Glucose, UA: NEGATIVE mg/dL
Ketones, POC UA: NEGATIVE mg/dL
Nitrite, UA: POSITIVE — AB
Protein Ur, POC: NEGATIVE mg/dL
Spec Grav, UA: 1.02 (ref 1.010–1.025)
Urobilinogen, UA: 0.2 E.U./dL
pH, UA: 6 (ref 5.0–8.0)

## 2022-11-26 MED ORDER — CIPROFLOXACIN HCL 500 MG PO TABS
500.0000 mg | ORAL_TABLET | Freq: Two times a day (BID) | ORAL | 0 refills | Status: AC
Start: 2022-11-26 — End: 2022-11-29

## 2022-11-26 NOTE — Discharge Instructions (Signed)
I have prescribed ciprofloxacin for presumed urinary tract infection.  If your symptoms worsen, if you develop fever, if your symptoms do not resolve, recommend going to the emergency room where you can receive appropriate imaging and lab testing to evaluate your symptoms.

## 2022-11-26 NOTE — ED Provider Notes (Signed)
[facility]    CSN: [id number] Arrival date & time: 11/26/22  1329      History   Chief Complaint Chief Complaint  Patient presents with   Urinary Frequency    Blood in urine, abdominal pain (comes and goes), pain when urinating, frequent urination but not a lot of urine - Entered by patient   Abdominal Pain   Hematuria    HPI Veronica [name] is a 47 y.o. female.    Urinary Frequency Associated symptoms include abdominal pain.  Abdominal Pain Associated symptoms: hematuria   Hematuria Associated symptoms include abdominal pain.    Patient presents to urgent care with complaint of UTI symptoms.  Endorses blood in her urine, abdominal pain that is intermittent, dysuria, urinary frequency with low volume.  Symptoms since 2 days.  She endorses intermittent left flank pain.  States sometimes doubled over in pain with nausea.  Also complains of fatigue.  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   Preeclampsia    with daughter   S/P tubal ligation 11/19/2020   Spina bifida (HCC)    caused djd lower back per pt    Patient Active Problem List   Diagnosis Date Noted   Snoring 02/23/2022   Acute intractable headache 02/23/2022   S/P tubal ligation 11/19/2020   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   Depression, recurrent (HCC) 11/25/2019   Gastroesophageal reflux disease without esophagitis 11/25/2019   Abdominal spasms 11/25/2019   Insomnia 11/25/2019   Chronic constipation 05/07/2019   Asthma 01/29/2019    Hyperlipidemia 12/13/2017   Anxiety and depression 11/01/2017   Panic attack 11/01/2017   Cardiac murmur 11/01/2017   Allergic rhinitis 11/01/2017   Acne vulgaris 11/01/2017   Acute non-recurrent maxillary sinusitis 08/16/2017   Acute sinusitis 03/01/2017   Annual physical exam 06/06/2016   Anxiety 06/06/2016   Allergy 06/06/2016    Past Surgical History:  Procedure Laterality Date   colonscopy  summer 2021   DILATION AND CURETTAGE OF UTERUS  06/13/2002   LAPAROSCOPIC BILATERAL SALPINGECTOMY Bilateral 11/19/2020   Procedure: LAPAROSCOPIC BILATERAL SALPINGECTOMY;  Surgeon: Sherian Rein, MD;  Location:  [facility];  Service: Gynecology;  Laterality: Bilateral;   vocal cord surgery  2008   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      Home Medications    Prior to Admission medications   Medication Sig Start Date End Date Taking? Authorizing Provider  albuterol (VENTOLIN HFA) 108 (90 Base) MCG/ACT inhaler Inhale 1-2 puffs into the lungs every 6 (six) hours as needed for wheezing or shortness of breath. 02/23/22   McLean-Scocuzza, Pasty Spillers, MD  buPROPion (WELLBUTRIN XL) 150 MG 24 hr tablet Take 1 tablet (150 mg total) by mouth daily. 02/13/22   McLean-Scocuzza, Pasty Spillers, MD  dicyclomine (BENTYL) 10 MG capsule Take 1 capsule (10  mg total) by mouth 4 (four) times daily -  before meals and at bedtime. 02/13/22   McLean-Scocuzza, Pasty Spillers, MD  fluticasone (FLONASE) 50 MCG/ACT nasal spray SHAKE LIQUID AND USE 2 SPRAYS IN EACH NOSTRIL DAILY AS NEEDED FOR ALLERGIES OR RHINITIS Patient not taking: Reported on 06/30/2021 05/27/21   McLean-Scocuzza, Pasty Spillers, MD  levonorgestrel (MIRENA) 20 MCG/24HR IUD 1 each by Intrauterine route once.    [provider]  LORazepam (ATIVAN) 0.5 MG tablet 1/2 pill at night as needed prn anxiety 02/23/22   McLean-Scocuzza, Pasty Spillers, MD  montelukast  (SINGULAIR) 10 MG tablet Take 1 tablet (10 mg total) by mouth at bedtime. Patient not taking: Reported on 06/30/2021 05/26/20   McLean-Scocuzza, Pasty Spillers, MD  omeprazole (PRILOSEC) 20 MG capsule TAKE 1 CAPSULE(20 MG) BY MOUTH TWICE DAILY BEFORE A MEAL 02/13/22   McLean-Scocuzza, Pasty Spillers, MD  sertraline (ZOLOFT) 100 MG tablet TAKE 1 TABLET(100 MG) BY MOUTH DAILY 02/13/22   McLean-Scocuzza, Pasty Spillers, MD    Family History Family History  Problem Relation Age of Onset   Hyperlipidemia Mother    Heart disease Father        heart valve issues s/p heart surgery    Hypertension Father    Diabetes Father    Colon polyps Father        precancerous   Stroke Father        in 74s   Heart attack Father    Endometriosis Sister    Hypertension Brother    Diabetes Brother    Arthritis Maternal Grandmother    Stroke Maternal Grandmother    Hyperlipidemia Maternal Grandmother    Hypertension Maternal Grandfather    Colon cancer Paternal Grandmother        dx age 15 y.o    Hypertension Paternal Grandfather    Diabetes Paternal Grandfather    Colon polyps Paternal Aunt    Breast cancer Neg Hx     Social History Social History   Tobacco Use   Smoking status: Never   Smokeless tobacco: Never  Vaping Use   Vaping Use: Never used  Substance Use Topics   Alcohol use: Yes    Alcohol/week: 2.0 standard drinks of alcohol    Types: 2 Standard drinks or equivalent per week    Comment: social   Drug use: No     Allergies   Doxycycline, Magnesium-containing compounds, Pseudoephedrine hcl, and Sulfa antibiotics   Review of Systems Review of Systems  Gastrointestinal:  Positive for abdominal pain.  Genitourinary:  Positive for frequency and hematuria.    Physical Exam Triage Vital Signs ED Triage Vitals [11/26/22 1353]  Enc Vitals Group     BP 132/87     Pulse Rate 88     Resp 16     Temp 99.1 F (37.3 C)     Temp Source Oral     SpO2 96 %     Weight      Height      Head  Circumference      Peak Flow      Pain Score      Pain Loc      Pain Edu?      Excl. in GC?    No data found.  Updated Vital Signs BP 132/87 (BP Location: Right Arm)   Pulse 88   Temp 99.1 F (37.3 C) (Oral)   Resp 16   SpO2 96%   Visual Acuity Right Eye Distance:   Left  Eye Distance:   Bilateral Distance:    Right Eye Near:   Left Eye Near:    Bilateral Near:     Physical Exam Vitals reviewed.  Constitutional:      General: She is in acute distress.     Appearance: She is well-developed. She is ill-appearing.  Abdominal:     General: Bowel sounds are normal.     Tenderness: There is abdominal tenderness in the left lower quadrant. Negative signs include Murphy's sign and McBurney's sign.  Skin:    General: Skin is warm and dry.  Neurological:     General: No focal deficit present.     Mental Status: She is alert and oriented to person, place, and time.  Psychiatric:        Mood and Affect: Mood normal.        Behavior: Behavior normal.      UC Treatments / Results  Labs (all labs ordered are listed, but only abnormal results are displayed) Labs Reviewed - No data to display  EKG   Radiology No results found.  Procedures Procedures (including critical care time)  Medications Ordered in UC Medications - No data to display  Initial Impression / Assessment and Plan / UC Course  I have reviewed the triage vital signs and the nursing notes.  Pertinent labs & imaging results that were available during my care of the patient were reviewed by me and considered in my medical decision making (see chart for details).   Veronica [name] is a 47 y.o. female presenting with LLQ pain and dysuria. Patient is afebrile without recent antipyretics, satting well on room air. Overall is ill appearing though non-toxic, well hydrated, without respiratory distress.  She appears in some acute distress.   Wide differential for her symptoms including but not limited to  urinary tract infection, diverticulitis, renal calculi.  She endorses no history of diverticulitis or kidney stones.  UA is not strongly suggestive of urinary tract infection with only trace leukocytes present though there are nitrites and trace blood.  Given her symptoms, which are not in line with UA results, cannot rule out other diagnoses including renal calculi and diverticulitis.  Informed patient that my recommendation would be that she be evaluated in the emergency room where she could undergo appropriate imaging and other labs to evaluate her symptoms.  After shared decision making, agreed to prescribe ciprofloxacin for presumed urinary tract infection and patient will watch and wait for worsening symptoms then go to the ED if necessary.  She will also continue to use Azo which has been effective in relieving her discomfort.  Reviewed relevant chart history.   Counseled patient on potential for adverse effects with medications prescribed/recommended today, ER and return-to-clinic precautions discussed, patient verbalized understanding and agreement with care plan.  Final Clinical Impressions(s) / UC Diagnoses   Final diagnoses:  None   Discharge Instructions   None    ED Prescriptions   None    PDMP not reviewed this encounter.   Charma Igo, [place] 11/26/22 1514

## 2022-11-26 NOTE — ED Triage Notes (Signed)
Pt c/o abdominal pain, headache, dysuria and hematuria x 2 days. Pt has taken AZO for relief.

## 2023-01-25 ENCOUNTER — Telehealth: Payer: Self-pay

## 2023-01-25 DIAGNOSIS — F419 Anxiety disorder, unspecified: Secondary | ICD-10-CM

## 2023-01-25 NOTE — Telephone Encounter (Signed)
LOV 02/23/2022 NOV none on file Last refilled 01/24/2022 #90 w 3 RF

## 2023-03-16 ENCOUNTER — Other Ambulatory Visit: Payer: Self-pay

## 2023-03-16 DIAGNOSIS — F32A Depression, unspecified: Secondary | ICD-10-CM

## 2023-03-16 DIAGNOSIS — F419 Anxiety disorder, unspecified: Secondary | ICD-10-CM

## 2023-03-16 MED ORDER — SERTRALINE HCL 100 MG PO TABS: ORAL_TABLET | ORAL | 0 refills | Status: DC

## 2023-03-16 MED ORDER — BUPROPION HCL ER (XL) 150 MG PO TB24: 150.0000 mg | ORAL_TABLET | Freq: Every day | ORAL | 0 refills | Status: DC

## 2023-03-16 NOTE — Telephone Encounter (Signed)
Pt has a TOC with you on 05/08/2023.

## 2023-03-16 NOTE — Telephone Encounter (Signed)
Pt has a TOC with Dr. Clent Ridges on 05/08/2023.

## 2023-03-16 NOTE — Telephone Encounter (Signed)
Prescription Request  03/16/2023  LOV: Visit date not found  What is the name of the medication or equipment? buPROPion and sertraline  Have you contacted your pharmacy to request a refill? No   Which pharmacy would you like this sent to?  [facility]  Patient notified that their request is being sent to the clinical staff for review and that they should receive a response within 2 business days.   Please advise at Mobile [phone] (mobile)

## 2023-04-13 ENCOUNTER — Ambulatory Visit: Payer: BC Managed Care – PPO | Admitting: Family Medicine

## 2023-05-05 ENCOUNTER — Other Ambulatory Visit: Payer: Self-pay | Admitting: Family Medicine

## 2023-05-05 DIAGNOSIS — R109 Unspecified abdominal pain: Secondary | ICD-10-CM

## 2023-05-05 DIAGNOSIS — K219 Gastro-esophageal reflux disease without esophagitis: Secondary | ICD-10-CM

## 2023-05-08 ENCOUNTER — Encounter: Payer: Self-pay | Admitting: Family Medicine

## 2023-05-08 ENCOUNTER — Ambulatory Visit: Payer: BC Managed Care – PPO | Admitting: Family Medicine

## 2023-05-08 VITALS — BP 126/70 | HR 84 | Temp 98.0°F | Resp 16 | Ht 63.0 in | Wt 135.2 lb

## 2023-05-08 DIAGNOSIS — E785 Hyperlipidemia, unspecified: Secondary | ICD-10-CM

## 2023-05-08 DIAGNOSIS — R7309 Other abnormal glucose: Secondary | ICD-10-CM

## 2023-05-08 DIAGNOSIS — E559 Vitamin D deficiency, unspecified: Secondary | ICD-10-CM

## 2023-05-08 DIAGNOSIS — R109 Unspecified abdominal pain: Secondary | ICD-10-CM

## 2023-05-08 DIAGNOSIS — F39 Unspecified mood [affective] disorder: Secondary | ICD-10-CM

## 2023-05-08 DIAGNOSIS — F410 Panic disorder [episodic paroxysmal anxiety] without agoraphobia: Secondary | ICD-10-CM | POA: Diagnosis not present

## 2023-05-08 DIAGNOSIS — R42 Dizziness and giddiness: Secondary | ICD-10-CM

## 2023-05-08 DIAGNOSIS — Z79899 Other long term (current) drug therapy: Secondary | ICD-10-CM

## 2023-05-08 DIAGNOSIS — Z1231 Encounter for screening mammogram for malignant neoplasm of breast: Secondary | ICD-10-CM

## 2023-05-08 DIAGNOSIS — J45909 Unspecified asthma, uncomplicated: Secondary | ICD-10-CM

## 2023-05-08 DIAGNOSIS — R5383 Other fatigue: Secondary | ICD-10-CM

## 2023-05-08 DIAGNOSIS — J4520 Mild intermittent asthma, uncomplicated: Secondary | ICD-10-CM

## 2023-05-08 DIAGNOSIS — K219 Gastro-esophageal reflux disease without esophagitis: Secondary | ICD-10-CM | POA: Diagnosis not present

## 2023-05-08 DIAGNOSIS — E538 Deficiency of other specified B group vitamins: Secondary | ICD-10-CM

## 2023-05-08 MED ORDER — SERTRALINE HCL 100 MG PO TABS
ORAL_TABLET | ORAL | 3 refills | Status: DC
Start: 2023-05-08 — End: 2023-05-14

## 2023-05-08 MED ORDER — LORAZEPAM 0.5 MG PO TABS
ORAL_TABLET | ORAL | 5 refills | Status: AC
Start: 2023-05-08 — End: ?

## 2023-05-08 MED ORDER — ALBUTEROL SULFATE HFA 108 (90 BASE) MCG/ACT IN AERS
1.0000 | INHALATION_SPRAY | Freq: Four times a day (QID) | RESPIRATORY_TRACT | 11 refills | Status: AC | PRN
Start: 2023-05-08 — End: ?

## 2023-05-08 MED ORDER — DICYCLOMINE HCL 10 MG PO CAPS: 10.0000 mg | ORAL_CAPSULE | Freq: Three times a day (TID) | ORAL | 3 refills | Status: DC

## 2023-05-08 MED ORDER — BUPROPION HCL ER (XL) 150 MG PO TB24: 150.0000 mg | ORAL_TABLET | Freq: Every day | ORAL | 3 refills | Status: DC

## 2023-05-08 MED ORDER — OMEPRAZOLE 20 MG PO CPDR: DELAYED_RELEASE_CAPSULE | ORAL | 3 refills | Status: DC

## 2023-05-08 NOTE — Patient Instructions (Signed)
It was a pleasure meeting you today. Thank you for allowing me to take part in your health care.  Our goals for today as we discussed include:  Schedule fasting lab appointment.,  Fast for 10 hours  Referral sent for Mammogram. Please call to schedule appointment. [facility] [address] [place], [place] [address] [phone]    Refills sent for requested medications  This is a list of the screening recommended for you and due dates:  Health Maintenance  Topic Date Due   Mammogram  12/08/2020   Pap with HPV screening  07/17/2021   COVID-19 Vaccine (4 - 2023-24 season) 02/18/2023   Flu Shot  09/17/2023*   DTaP/Tdap/Td vaccine (6 - Td or Tdap) 05/13/2024   Colon Cancer Screening  06/30/2029   Hepatitis C Screening  Completed   HIV Screening  Addressed   HPV Vaccine  Aged Out  *Topic was postponed. The date shown is not the original due date.     If you have any questions or concerns, please do not hesitate to call the office at [phone].  I look forward to our next visit and until then take care and stay safe.  Regards,   Dana Allan, MD   [facility]

## 2023-05-08 NOTE — Progress Notes (Signed)
SUBJECTIVE:   Chief Complaint  Patient presents with   Establish Care    Transferring from McLean-Scocuzza, Pasty Spillers, MD     HPI Presents to clinic to transfer care  Discussed the use of AI scribe software for clinical note transcription with the patient, who gave verbal consent to proceed.  History of Present Illness The patient, previously under the care of Dr. Shirlee Latch, presents for a transfer of care consultation. Their last visit with Dr. Shirlee Latch was in October of the previous year. The patient has a history of asthma, diagnosed in their early twenties, and is currently using albuterol as needed. They were initially seen by an allergist and have identified allergies to sulfur, pseudofedrin, magnesium, trees, dogs, and cats.  The patient is also on Wellbutrin and Zoloft, prescribed by Dr. French Ana, for the management of depression and anxiety. They have been on Zoloft for approximately five years and Wellbutrin was added about two and a half years ago. The patient reports that these medications are mostly effective, but they have been feeling more stressed and tired due to a recent job change. They are currently seeing a therapist once a month or every two months.  In addition to the above, the patient has been experiencing problems with reflux and is on omeprazole. They also have a history of vertigo, for which they take meclizine, although episodes are rare. The patient also has a Mirena IUD in place for menstrual management, as they have had their tubes removed.  The patient occasionally takes lorazepam for stress, but usage is infrequent. They also report a history of latent tuberculosis, for which they completed a six-month course of treatment. 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  The patient reports no current issues with diarrhea, constipation, bloody stool, urination problems, chest pain, shortness of  breath, or swelling in the legs.    PERTINENT PMH / PSH: As above  OBJECTIVE:  BP 126/70   Pulse 84   Temp 98 F (36.7 C)   Resp 16   Ht 5\' 3"  (1.6 m)   Wt 135 lb 4 oz (61.3 kg)   SpO2 100%   BMI 23.96 kg/m    Physical Exam Vitals reviewed.  Constitutional:      General: She is not in acute distress.    Appearance: She is normal weight. She is not ill-appearing.  HENT:     Head: Normocephalic.     Right Ear: Tympanic membrane, ear canal and external ear normal.     Left Ear: Tympanic membrane, ear canal and external ear normal.     Nose: Nose normal.     Mouth/Throat:     Mouth: Mucous membranes are moist.  Eyes:     Extraocular Movements: Extraocular movements intact.     Conjunctiva/sclera: Conjunctivae normal.     Pupils: Pupils are equal, round, and reactive to light.  Neck:     Thyroid: No thyromegaly or thyroid tenderness.     Vascular: No carotid bruit.  Cardiovascular:     Rate and Rhythm: Normal rate and regular rhythm.     Pulses: Normal pulses.     Heart sounds: Normal heart sounds.  Pulmonary:     Effort: Pulmonary effort is normal.     Breath sounds: Normal breath sounds.  Abdominal:     General: Bowel sounds are normal. There is no distension.     Palpations: Abdomen is soft.  Tenderness: There is no abdominal tenderness. There is no right CVA tenderness, left CVA tenderness, guarding or rebound.  Musculoskeletal:        General: Normal range of motion.     Cervical back: Normal range of motion.     Right lower leg: No edema.     Left lower leg: No edema.  Lymphadenopathy:     Cervical: No cervical adenopathy.  Skin:    Capillary Refill: Capillary refill takes less than 2 seconds.  Neurological:     General: No focal deficit present.     Mental Status: She is alert and oriented to person, place, and time. Mental status is at baseline.     Motor: No weakness.  Psychiatric:        Mood and Affect: Mood normal.        Behavior: Behavior  normal.        Thought Content: Thought content normal.        Judgment: Judgment normal.        05/08/2023   10:30 AM 02/23/2022   10:44 AM 11/25/2019    1:47 PM 05/07/2019    1:01 PM 03/12/2018    4:02 PM  Depression screen PHQ 2/9  Decreased Interest 0 0 0 2 0  Down, Depressed, Hopeless 0 0 0 2 0  PHQ - 2 Score 0 0 0 4 0  Altered sleeping 2  3 2    Tired, decreased energy 3  1 2    Change in appetite 0  0 1   Feeling bad or failure about yourself  0  0 0   Trouble concentrating 1  0 1   Moving slowly or fidgety/restless 0  0 1   Suicidal thoughts 0  0 0   PHQ-9 Score 6  4 11    Difficult doing work/chores Somewhat difficult  Very difficult Very difficult       05/08/2023   10:30 AM 05/07/2019    1:03 PM  GAD 7 : Generalized Anxiety Score  Nervous, Anxious, on Edge 1 1  Control/stop worrying 0 1  Worry too much - different things 0 1  Trouble relaxing 1 1  Restless 0 0  Easily annoyed or irritable 1 1  Afraid - awful might happen 0 0  Total GAD 7 Score 3 5  Anxiety Difficulty Somewhat difficult Somewhat difficult    ASSESSMENT/PLAN:  Mood disorder (HCC) Assessment & Plan: Managed with Sertraline 100mg  and Wellbutrin 150mg . Patient reports increased stress and fatigue, possibly related to job change. Patient sees a therapist approximately once a month. -Order comprehensive blood work to rule out potential causes of fatigue (e.g., iron deficiency, B12 deficiency, vitamin D deficiency, electrolyte imbalance, thyroid issues). -Consider potential adjustment of Wellbutrin dosage pending lab results. -Refill Wellbutrin XL 150 mg daily -Refill Zoloft 100 mg daily -Continue CBT  Orders: -     LORazepam; 1/2 pill at night as needed prn anxiety  Dispense: 30 tablet; Refill: 5 -     TSH; Future -     CBC; Future -     ToxASSURE Select 13 (MW), Urine; Future  Abdominal spasms -     Dicyclomine HCl; Take 1 capsule (10 mg total) by mouth 4 (four) times daily -  before meals and  at bedtime.  Dispense: 270 capsule; Refill: 3 -     Omeprazole; TAKE 1 CAPSULE(20 MG) BY MOUTH TWICE DAILY BEFORE A MEAL  Dispense: 180 capsule; Refill: 3  Panic attack -  LORazepam; 1/2 pill at night as needed prn anxiety  Dispense: 30 tablet; Refill: 5  Gastroesophageal reflux disease without esophagitis Assessment & Plan: Managed with Omeprazole. Patient reports recent increase in reflux symptoms, possibly stress-related. -Continue Omeprazole.  Orders: -     Omeprazole; TAKE 1 CAPSULE(20 MG) BY MOUTH TWICE DAILY BEFORE A MEAL  Dispense: 180 capsule; Refill: 3  Hyperlipidemia, unspecified hyperlipidemia type Assessment & Plan: The 10-year ASCVD risk score (Arnett DK, et al., 2019) is: 1.5%  Check lipids  Orders: -     Lipid panel; Future -     Comprehensive metabolic panel; Future  Vitamin B 12 deficiency -     Vitamin B12; Future  Vitamin D deficiency -     VITAMIN D 25 Hydroxy (Vit-D Deficiency, Fractures); Future  Abnormal glucose -     Hemoglobin A1c; Future  Breast cancer screening by mammogram -     3D Screening Mammogram, Left and Right; Future  Chronic use of benzodiazepine for therapeutic purpose Assessment & Plan: Intermittent use of Xanax 0.25 mg for anxiety Discussed risks of long term use of benzodiazepines including falls, confusion and association with cognitive impairment.  PDMP reviewed and appropriate.   Recommend use of Xanax sparingly and patient agreed Refill Xanax 0.5 mg 1/2 tablet as needed for anxiety UDS and non opioid contract obtained today  Orders: -     LORazepam; 1/2 pill at night as needed prn anxiety  Dispense: 30 tablet; Refill: 5 -     ToxASSURE Select 13 (MW), Urine; Future  Mild intermittent asthma without complication Assessment & Plan: Diagnosed in early twenties, currently managed with Albuterol as needed. Allergies to sulfur, pseudofedrin, magnesium, trees, dogs, and cats. -Refill Albuterol prescription.  Orders: -      Albuterol Sulfate HFA; Inhale 1-2 puffs into the lungs every 6 (six) hours as needed for wheezing or shortness of breath.  Dispense: 18 g; Refill: 11  Chronic vertigo Assessment & Plan: Rare episodes, managed with Meclizine as needed. -No changes to current management.    General Health Maintenance -Confirm status of Mirena IUD, inserted 01/29/ 2018 by Dr Floyde Parkins at [facility] in [place].  Will need referral to OB if wanting replacement in 06/2024 -Follow up with OB to have cervical cancer screening updated   PDMP reviewed  Return if symptoms worsen or fail to improve, for PCP.  Dana Allan, MD

## 2023-05-14 ENCOUNTER — Other Ambulatory Visit: Payer: Self-pay | Admitting: Family Medicine

## 2023-05-14 DIAGNOSIS — F39 Unspecified mood [affective] disorder: Secondary | ICD-10-CM

## 2023-05-16 ENCOUNTER — Other Ambulatory Visit: Payer: BC Managed Care – PPO

## 2023-05-16 DIAGNOSIS — E538 Deficiency of other specified B group vitamins: Secondary | ICD-10-CM | POA: Diagnosis not present

## 2023-05-16 DIAGNOSIS — R7309 Other abnormal glucose: Secondary | ICD-10-CM | POA: Diagnosis not present

## 2023-05-16 DIAGNOSIS — F39 Unspecified mood [affective] disorder: Secondary | ICD-10-CM | POA: Diagnosis not present

## 2023-05-16 DIAGNOSIS — E785 Hyperlipidemia, unspecified: Secondary | ICD-10-CM | POA: Diagnosis not present

## 2023-05-16 DIAGNOSIS — R5383 Other fatigue: Secondary | ICD-10-CM

## 2023-05-16 DIAGNOSIS — E559 Vitamin D deficiency, unspecified: Secondary | ICD-10-CM | POA: Diagnosis not present

## 2023-05-16 DIAGNOSIS — Z79899 Other long term (current) drug therapy: Secondary | ICD-10-CM

## 2023-05-16 LAB — LIPID PANEL
Cholesterol: 259 mg/dL — ABNORMAL HIGH (ref 0–200)
HDL: 49.2 mg/dL (ref >39.00)
LDL Cholesterol: 195 mg/dL — ABNORMAL HIGH (ref 0–99)
NonHDL: 210.2
Total CHOL/HDL Ratio: 5
Triglycerides: 74 mg/dL (ref 0.0–149.0)
VLDL: 14.8 mg/dL (ref 0.0–40.0)

## 2023-05-16 LAB — TSH: TSH: 1.22 u[IU]/mL (ref 0.35–5.50)

## 2023-05-16 LAB — VITAMIN B12: Vitamin B-12: 220 pg/mL (ref 211–911)

## 2023-05-16 LAB — CBC
HCT: 39.9 % (ref 36.0–46.0)
Hemoglobin: 13 g/dL (ref 12.0–15.0)
MCHC: 32.6 g/dL (ref 30.0–36.0)
MCV: 85.9 fL (ref 78.0–100.0)
Platelets: 274 10*3/uL (ref 150.0–400.0)
RBC: 4.64 Mil/uL (ref 3.87–5.11)
RDW: 13.8 % (ref 11.5–15.5)
WBC: 8.3 10*3/uL (ref 4.0–10.5)

## 2023-05-16 LAB — COMPREHENSIVE METABOLIC PANEL
ALT: 11 U/L (ref 0–35)
AST: 14 U/L (ref 0–37)
Albumin: 4.6 g/dL (ref 3.5–5.2)
Alkaline Phosphatase: 53 U/L (ref 39–117)
BUN: 18 mg/dL (ref 6–23)
CO2: 29 meq/L (ref 19–32)
Calcium: 9.6 mg/dL (ref 8.4–10.5)
Chloride: 102 meq/L (ref 96–112)
Creatinine, Ser: 0.79 mg/dL (ref 0.40–1.20)
GFR: 89.81 mL/min (ref >60.00)
Glucose, Bld: 93 mg/dL (ref 70–99)
Potassium: 4.5 meq/L (ref 3.5–5.1)
Sodium: 138 meq/L (ref 135–145)
Total Bilirubin: 0.6 mg/dL (ref 0.2–1.2)
Total Protein: 7 g/dL (ref 6.0–8.3)

## 2023-05-16 LAB — VITAMIN D 25 HYDROXY (VIT D DEFICIENCY, FRACTURES): VITD: 31.45 ng/mL (ref 30.00–100.00)

## 2023-05-16 LAB — HEMOGLOBIN A1C: Hgb A1c MFr Bld: 5.8 % (ref 4.6–6.5)

## 2023-05-17 ENCOUNTER — Encounter: Payer: Self-pay | Admitting: Family Medicine

## 2023-05-17 DIAGNOSIS — E559 Vitamin D deficiency, unspecified: Secondary | ICD-10-CM | POA: Insufficient documentation

## 2023-05-17 DIAGNOSIS — Z79899 Other long term (current) drug therapy: Secondary | ICD-10-CM | POA: Insufficient documentation

## 2023-05-17 DIAGNOSIS — E538 Deficiency of other specified B group vitamins: Secondary | ICD-10-CM | POA: Insufficient documentation

## 2023-05-17 DIAGNOSIS — Z1231 Encounter for screening mammogram for malignant neoplasm of breast: Secondary | ICD-10-CM | POA: Insufficient documentation

## 2023-05-17 DIAGNOSIS — R7309 Other abnormal glucose: Secondary | ICD-10-CM | POA: Insufficient documentation

## 2023-05-17 DIAGNOSIS — F39 Unspecified mood [affective] disorder: Secondary | ICD-10-CM | POA: Insufficient documentation

## 2023-05-17 DIAGNOSIS — R42 Dizziness and giddiness: Secondary | ICD-10-CM | POA: Insufficient documentation

## 2023-05-17 DIAGNOSIS — R5383 Other fatigue: Secondary | ICD-10-CM | POA: Insufficient documentation

## 2023-05-17 NOTE — Assessment & Plan Note (Signed)
Managed with Omeprazole. Patient reports recent increase in reflux symptoms, possibly stress-related. -Continue Omeprazole.

## 2023-05-17 NOTE — Assessment & Plan Note (Signed)
Rare episodes, managed with Meclizine as needed. -No changes to current management.

## 2023-05-17 NOTE — Assessment & Plan Note (Signed)
Check labs today.

## 2023-05-17 NOTE — Assessment & Plan Note (Signed)
The 10-year ASCVD risk score (Arnett DK, et al., 2019) is: 1.5%  Check lipids

## 2023-05-17 NOTE — Assessment & Plan Note (Signed)
Intermittent use of Xanax 0.25 mg for anxiety Discussed risks of long term use of benzodiazepines including falls, confusion and association with cognitive impairment.  PDMP reviewed and appropriate.   Recommend use of Xanax sparingly and patient agreed Refill Xanax 0.5 mg 1/2 tablet as needed for anxiety UDS and non opioid contract obtained today

## 2023-05-17 NOTE — Assessment & Plan Note (Signed)
Diagnosed in early twenties, currently managed with Albuterol as needed. Allergies to sulfur, pseudofedrin, magnesium, trees, dogs, and cats. -Refill Albuterol prescription.

## 2023-05-17 NOTE — Assessment & Plan Note (Signed)
Managed with Sertraline 100mg  and Wellbutrin 150mg . Patient reports increased stress and fatigue, possibly related to job change. Patient sees a therapist approximately once a month. -Order comprehensive blood work to rule out potential causes of fatigue (e.g., iron deficiency, B12 deficiency, vitamin D deficiency, electrolyte imbalance, thyroid issues). -Consider potential adjustment of Wellbutrin dosage pending lab results. -Refill Wellbutrin XL 150 mg daily -Refill Zoloft 100 mg daily -Continue CBT

## 2023-05-20 LAB — TOXASSURE SELECT 13 (MW), URINE

## 2023-06-21 ENCOUNTER — Ambulatory Visit: Payer: BC Managed Care – PPO | Admitting: Family Medicine

## 2023-06-21 ENCOUNTER — Ambulatory Visit: Payer: 59 | Admitting: Nurse Practitioner

## 2023-06-21 ENCOUNTER — Ambulatory Visit: Payer: 59

## 2023-06-21 VITALS — BP 118/76 | HR 75 | Temp 98.5°F | Ht 63.0 in | Wt 136.4 lb

## 2023-06-21 DIAGNOSIS — M25512 Pain in left shoulder: Secondary | ICD-10-CM | POA: Diagnosis not present

## 2023-06-21 DIAGNOSIS — M25561 Pain in right knee: Secondary | ICD-10-CM

## 2023-06-21 NOTE — Progress Notes (Signed)
 Leron Glance, NP-C Phone: [phone]  Veronica [name] is a 48 y.o. female who presents today for shoulder and knee pain.   Discussed the use of AI scribe software for clinical note transcription with the patient, who gave verbal consent to proceed.  History of Present Illness   The patient presents with pain in the right knee and left shoulder. The knee pain began approximately three weeks ago following a fall on a metal-edged, spiral staircase, where the patient's right kneecap was directly impacted. The pain is intermittent, sometimes unbearable, particularly when crawling across the bed, but at other times, it is absent. The knee was initially swollen but is no longer, and it remains tender across the top of the kneecap.  The left shoulder pain has been ongoing for a couple of weeks and is progressively worsening. The patient cannot recall a specific injury that may have caused it. The pain is constant, severe enough to wake the patient at night, and is felt across the front of the shoulder and under the armpit. Occasionally, the pain radiates down the arm. The patient has tried Biofreeze, a hot pad, and Motrin  for pain relief, but these have provided minimal relief. 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  The patient also mentioned helping her father rake leaves recently, which resulted in increased knee pain that night.      Social History   Tobacco Use  Smoking Status Never  Smokeless Tobacco Never    Current Outpatient Medications on File Prior to Visit  Medication Sig Dispense Refill   albuterol  (VENTOLIN  HFA) 108 (90 Base) MCG/ACT inhaler Inhale 1-2 puffs into the lungs every 6 (six) hours as needed for wheezing or shortness of breath. 18 g 11   buPROPion  (WELLBUTRIN  XL) 150 MG 24 hr tablet TAKE 1 TABLET(150 MG) BY MOUTH DAILY 60 tablet 1    dicyclomine  (BENTYL ) 10 MG capsule Take 1 capsule (10 mg total) by mouth 4 (four) times daily -  before meals and at bedtime. 270 capsule 3   levonorgestrel (MIRENA) 20 MCG/24HR IUD 1 each by Intrauterine route once.     LORazepam  (ATIVAN ) 0.5 MG tablet 1/2 pill at night as needed prn anxiety 30 tablet 5   meclizine (ANTIVERT) 12.5 MG tablet      omeprazole  (PRILOSEC) 20 MG capsule TAKE 1 CAPSULE(20 MG) BY MOUTH TWICE DAILY BEFORE A MEAL 180 capsule 3   sertraline  (ZOLOFT ) 100 MG tablet TAKE 1 TABLET(100 MG) BY MOUTH DAILY 60 tablet 1   No current facility-administered medications on file prior to visit.     ROS see history of present illness  Objective  Physical Exam Vitals:   06/21/23 0924  BP: 118/76  Pulse: 75  Temp: 98.5 F (36.9 C)  SpO2: 99%    BP Readings from Last 3 Encounters:  06/21/23 118/76  05/08/23 126/70  11/26/22 132/87   Wt Readings from Last 3 Encounters:  06/21/23 136 lb 6.4 oz (61.9 kg)  05/08/23 135 lb 4 oz (61.3 kg)  02/23/22 133 lb (60.3 kg)    Physical Exam Constitutional:      General: She is not in acute distress.    Appearance: Normal appearance.  HENT:     Head: Normocephalic.  Cardiovascular:     Rate and Rhythm: Normal rate and regular rhythm.     Heart sounds: Normal heart sounds.  Pulmonary:  Effort: Pulmonary effort is normal.     Breath sounds: Normal breath sounds.  Musculoskeletal:     Right shoulder: Normal.     Left shoulder: Tenderness present. Decreased range of motion (limited by pain). Decreased strength (limited by pain).     Right knee: No swelling. Normal range of motion. No tenderness.     Left knee: Normal.  Skin:    General: Skin is warm and dry.  Neurological:     General: No focal deficit present.     Mental Status: She is alert.  Psychiatric:        Mood and Affect: Mood normal.        Behavior: Behavior normal.    Assessment/Plan: Please see individual problem list.  Acute pain of left  shoulder Assessment & Plan: She reports constant and severe pain across the front and under the armpit, radiating down the arm with a limited range of motion and no relief from Biofreeze, heat pad, or Motrin . We will order a shoulder X-ray and refer her to Orthopedics for further evaluation and possible injection therapy. She should continue using Biofreeze, Motrin  as needed, alternate ice and heat, and consider Salonpas patches for prolonged relief.  Orders: -     DG Shoulder Left; Future -     Ambulatory referral to Orthopedics  Acute pain of right knee Assessment & Plan: She describes pain and tenderness across the top of the kneecap following a fall on a metal staircase approximately three weeks ago, noting the pain is intermittent and varies in intensity. We will monitor the knee pain for now and advise her to continue using ice, Tylenol  or ibuprofen , and Biofreeze as needed. Strength and ROM normal on exam. No tenderness or swelling noted.      Return if symptoms worsen or fail to improve.   Leron Glance, NP-C [facility]

## 2023-06-21 NOTE — Assessment & Plan Note (Signed)
 She describes pain and tenderness across the top of the kneecap following a fall on a metal staircase approximately three weeks ago, noting the pain is intermittent and varies in intensity. We will monitor the knee pain for now and advise her to continue using ice, Tylenol  or ibuprofen , and Biofreeze as needed. Strength and ROM normal on exam. No tenderness or swelling noted.

## 2023-06-21 NOTE — Assessment & Plan Note (Addendum)
 She reports constant and severe pain across the front and under the armpit, radiating down the arm with a limited range of motion and no relief from Biofreeze, heat pad, or Motrin . We will order a shoulder X-ray and refer her to Orthopedics for further evaluation and possible injection therapy. She should continue using Biofreeze, Motrin  as needed, alternate ice and heat, and consider Salonpas patches for prolonged relief.

## 2023-07-03 ENCOUNTER — Telehealth: Payer: Self-pay

## 2023-07-03 NOTE — Telephone Encounter (Signed)
 Called pt in regards to xray results and provider having a question for pt :    Gretel App, NP to [facility]     07/03/23  7:44 AM Result Note I would like her to see Ortho regarding her shoulder x-ray results. I had placed a referral at her appointment. Does she have an appointment scheduled?   (Please read message above to pt and send note back with pts response)

## 2023-07-09 ENCOUNTER — Ambulatory Visit: Payer: BC Managed Care – PPO | Admitting: Family Medicine

## 2023-07-21 ENCOUNTER — Ambulatory Visit
Admission: EM | Admit: 2023-07-21 | Discharge: 2023-07-21 | Disposition: A | Discharge status: Home / Self Care | Payer: 59 | Attending: Emergency Medicine | Admitting: Emergency Medicine

## 2023-07-21 DIAGNOSIS — J069 Acute upper respiratory infection, unspecified: Secondary | ICD-10-CM | POA: Diagnosis not present

## 2023-07-21 LAB — POC COVID19/FLU A&B COMBO
Covid Antigen, POC: NEGATIVE
Influenza A Antigen, POC: NEGATIVE
Influenza B Antigen, POC: NEGATIVE

## 2023-07-21 MED ORDER — CYCLOBENZAPRINE HCL 5 MG PO TABS: 5.0000 mg | ORAL_TABLET | Freq: Three times a day (TID) | ORAL | 0 refills | Status: AC | PRN

## 2023-07-21 MED ORDER — AMOXICILLIN-POT CLAVULANATE 875-125 MG PO TABS: 1.0000 | ORAL_TABLET | Freq: Two times a day (BID) | ORAL | 0 refills | Status: AC

## 2023-07-21 NOTE — ED Provider Notes (Signed)
Veronica [name]    CSN: [id number] Arrival date & time: 07/21/23  1217      History   Chief Complaint No chief complaint on file.   HPI Veronica [name] is a 48 y.o. female.   Patient presents for evaluation of fatigue, malaise, brain fog, very mild nonproductive cough, congestion, sinus pressure along the forehead and a sensation of swelling to the throat present for 3 days.  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  Decreased appetite but tolerating fluids.  Has attempted use of Mucinex.  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   Preeclampsia    with daughter   S/P tubal ligation 11/19/2020   Spina bifida (HCC)    caused djd lower back per pt    Patient Active Problem List   Diagnosis Date Noted   Acute pain of left shoulder 06/21/2023   Acute pain of right knee 06/21/2023   Mood disorder (HCC) 05/17/2023   Fatigue 05/17/2023   Vitamin B 12 deficiency 05/17/2023   Vitamin D deficiency 05/17/2023   Abnormal glucose 05/17/2023   Breast cancer screening by mammogram 05/17/2023   Chronic use of benzodiazepine for therapeutic purpose 05/17/2023   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   Snoring 02/23/2022   Acute intractable headache 02/23/2022   S/P tubal ligation 11/19/2020   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   Depression, recurrent (HCC) 11/25/2019   Gastroesophageal reflux disease without esophagitis 11/25/2019   Abdominal spasms 11/25/2019   Insomnia 11/25/2019   Chronic constipation 05/07/2019    Asthma 01/29/2019   Hyperlipidemia 12/13/2017   Anxiety and depression 11/01/2017   Panic attack 11/01/2017   Cardiac murmur 11/01/2017   Allergic rhinitis 11/01/2017   Acne vulgaris 11/01/2017   Acute non-recurrent maxillary sinusitis 08/16/2017   Acute sinusitis 03/01/2017   Annual physical exam 06/06/2016   Anxiety 06/06/2016   Allergy 06/06/2016    Past Surgical History:  Procedure Laterality Date   colonscopy  summer 2021   DILATION AND CURETTAGE OF UTERUS  06/13/2002   LAPAROSCOPIC BILATERAL SALPINGECTOMY Bilateral 11/19/2020   Procedure: LAPAROSCOPIC BILATERAL SALPINGECTOMY;  Surgeon: Sherian Rein, MD;  Location: [facility];  Service: Gynecology;  Laterality: Bilateral;   vocal cord surgery  2008   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      Home Medications    Prior to Admission medications   Medication Sig Start Date End Date Taking? Authorizing Provider  albuterol (VENTOLIN HFA) 108 (90 Base) MCG/ACT inhaler Inhale 1-2 puffs into the lungs every 6 (six) hours as needed for wheezing or shortness of breath. 05/08/23   Dana Allan, MD  amoxicillin-clavulanate (AUGMENTIN) 875-125 MG tablet Take 1 tablet by mouth every 12 (twelve) hours. 07/28/23  Yes Charmaine Placido R, NP  buPROPion (WELLBUTRIN XL) 150 MG 24 hr tablet TAKE 1  TABLET(150 MG) BY MOUTH DAILY 05/14/23   Dana Allan, MD  cyclobenzaprine (FLEXERIL) 5 MG tablet Take 1 tablet (5 mg total) by mouth 3 (three) times daily as needed for muscle spasms. 07/21/23  Yes Colonel Krauser R, NP  dicyclomine (BENTYL) 10 MG capsule Take 1 capsule (10 mg total) by mouth 4 (four) times daily -  before meals and at bedtime. 05/08/23   Dana Allan, MD  levonorgestrel (MIRENA) 20 MCG/24HR IUD 1 each by Intrauterine route once.    [provider]  LORazepam (ATIVAN) 0.5 MG tablet 1/2 pill at night as needed prn  anxiety 05/08/23   Dana Allan, MD  meclizine (ANTIVERT) 12.5 MG tablet     [provider]  omeprazole (PRILOSEC) 20 MG capsule TAKE 1 CAPSULE(20 MG) BY MOUTH TWICE DAILY BEFORE A MEAL 05/08/23   Dana Allan, MD  sertraline (ZOLOFT) 100 MG tablet TAKE 1 TABLET(100 MG) BY MOUTH DAILY 05/14/23   Dana Allan, MD    Family History Family History  Problem Relation Age of Onset   Hyperlipidemia Mother    Heart disease Father        heart valve issues s/p heart surgery    Hypertension Father    Diabetes Father    Colon polyps Father        precancerous   Stroke Father        in 48s   Heart attack Father    Endometriosis Sister    Hypertension Brother    Diabetes Brother    Arthritis Maternal Grandmother    Stroke Maternal Grandmother    Hyperlipidemia Maternal Grandmother    Hypertension Maternal Grandfather    Colon cancer Paternal Grandmother        dx age 59 y.o    Hypertension Paternal Grandfather    Diabetes Paternal Grandfather    Colon polyps Paternal Aunt    Breast cancer Neg Hx     Social History Social History   Tobacco Use   Smoking status: Never   Smokeless tobacco: Never  Vaping Use   Vaping status: Never Used  Substance Use Topics   Alcohol use: Yes    Alcohol/week: 2.0 standard drinks of alcohol    Types: 2 Standard drinks or equivalent per week    Comment: social   Drug use: No     Allergies   Doxycycline, Magnesium-containing compounds, Other, Pseudoephedrine, Pseudoephedrine hcl, and Sulfa antibiotics   Review of Systems Review of Systems   Physical Exam Triage Vital Signs ED Triage Vitals [07/21/23 1348]  Encounter Vitals Group     BP (!) 141/89     Systolic BP Percentile      Diastolic BP Percentile      Pulse Rate 82     Resp 16     Temp 98.7 F (37.1 C)     Temp Source Oral     SpO2 98 %     Weight      Height      Head Circumference      Peak Flow      Pain Score      Pain Loc      Pain Education      Exclude  from Growth Chart    No data found.  Updated Vital Signs BP (!) 141/89 (BP Location: Left Arm)   Pulse 82   Temp 98.7 F (37.1 C) (Oral)   Resp 16   SpO2 98%   Visual Acuity Right Eye Distance:   Left  Eye Distance:   Bilateral Distance:    Right Eye Near:   Left Eye Near:    Bilateral Near:     Physical Exam Constitutional:      Appearance: She is ill-appearing.  HENT:     Head: Normocephalic.     Right Ear: Tympanic membrane, ear canal and external ear normal.     Left Ear: Tympanic membrane, ear canal and external ear normal.     Nose: Congestion and rhinorrhea present.     Mouth/Throat:     Mouth: Mucous membranes are moist.     Pharynx: Oropharynx is clear. No oropharyngeal exudate or posterior oropharyngeal erythema.  Eyes:     Extraocular Movements: Extraocular movements intact.  Cardiovascular:     Rate and Rhythm: Normal rate and regular rhythm.     Pulses: Normal pulses.     Heart sounds: Normal heart sounds.  Pulmonary:     Effort: Pulmonary effort is normal.     Breath sounds: Normal breath sounds.  Musculoskeletal:     Cervical back: Normal range of motion and neck supple.  Neurological:     Mental Status: She is alert and oriented to person, place, and time. Mental status is at baseline.      UC Treatments / Results  Labs (all labs ordered are listed, but only abnormal results are displayed) Labs Reviewed  POC COVID19/FLU A&B COMBO - Normal    EKG   Radiology No results found.  Procedures Procedures (including critical care time)  Medications Ordered in UC Medications - No data to display  Initial Impression / Assessment and Plan / UC Course  I have reviewed the triage vital signs and the nursing notes.  Pertinent labs & imaging results that were available during my care of the patient were reviewed by me and considered in my medical decision making (see chart for details).  Viral URI with cough  Patient is in no signs of  distress nor toxic appearing.  Vital signs are stable.  Low suspicion for pneumonia, pneumothorax or bronchitis and therefore will defer imaging.  Flu test negative.  Prescribed Flexeril.  20 antibiotic placed at pharmacy if no improvement seen.May use additional over-the-counter medications as needed for supportive care.  May follow-up with urgent care as needed if symptoms persist or worsen.  Note given.   Final Clinical Impressions(s) / UC Diagnoses   Final diagnoses:  Viral URI with cough     Discharge Instructions      Your symptoms today are most likely being caused by a virus and should steadily improve in time it can take up to 7 to 10 days before you truly start to see a turnaround however things will get better if no improvement seen by next Saturday you may pick up Augmentin from the pharmacy for bacterial coverage    Covid and Flu test negative  May use muscle relaxant every 8 hours to help with bodyaches You can take Tylenol and/or Ibuprofen as needed for fever reduction and pain relief.   For cough: honey 1/2 to 1 teaspoon (you can dilute the honey in water or another fluid).  You can also use guaifenesin and dextromethorphan for cough. You can use a humidifier for chest congestion and cough.  If you don't have a humidifier, you can sit in the bathroom with the hot shower running.      For sore throat: try warm salt water gargles, cepacol lozenges, throat spray, warm tea or water with lemon/honey, popsicles or  ice, or OTC cold relief medicine for throat discomfort.   For congestion: take a daily anti-histamine like Zyrtec, Claritin, and a oral decongestant, such as pseudoephedrine.  You can also use Flonase 1-2 sprays in each nostril daily.   It is important to stay hydrated: drink plenty of fluids (water, gatorade/powerade/pedialyte, juices, or teas) to keep your throat moisturized and help further relieve irritation/discomfort.    ED Prescriptions     Medication Sig  Dispense Auth. Provider   cyclobenzaprine (FLEXERIL) 5 MG tablet Take 1 tablet (5 mg total) by mouth 3 (three) times daily as needed for muscle spasms. 20 tablet Salli Quarry R, NP   amoxicillin-clavulanate (AUGMENTIN) 875-125 MG tablet Take 1 tablet by mouth every 12 (twelve) hours. 14 tablet Daimon Kean, Elita Boone, NP      PDMP not reviewed this encounter.   Valinda Hoar, NP 07/21/23 1419

## 2023-07-21 NOTE — Discharge Instructions (Signed)
Your symptoms today are most likely being caused by a virus and should steadily improve in time it can take up to 7 to 10 days before you truly start to see a turnaround however things will get better if no improvement seen by next Saturday you may pick up Augmentin from the pharmacy for bacterial coverage    Covid and Flu test negative  May use muscle relaxant every 8 hours to help with bodyaches You can take Tylenol and/or Ibuprofen as needed for fever reduction and pain relief.   For cough: honey 1/2 to 1 teaspoon (you can dilute the honey in water or another fluid).  You can also use guaifenesin and dextromethorphan for cough. You can use a humidifier for chest congestion and cough.  If you don't have a humidifier, you can sit in the bathroom with the hot shower running.      For sore throat: try warm salt water gargles, cepacol lozenges, throat spray, warm tea or water with lemon/honey, popsicles or ice, or OTC cold relief medicine for throat discomfort.   For congestion: take a daily anti-histamine like Zyrtec, Claritin, and a oral decongestant, such as pseudoephedrine.  You can also use Flonase 1-2 sprays in each nostril daily.   It is important to stay hydrated: drink plenty of fluids (water, gatorade/powerade/pedialyte, juices, or teas) to keep your throat moisturized and help further relieve irritation/discomfort.

## 2023-07-21 NOTE — ED Triage Notes (Signed)
Triage per provider

## 2023-10-03 ENCOUNTER — Telehealth: Payer: Self-pay | Admitting: Family Medicine

## 2023-10-03 NOTE — Telephone Encounter (Signed)
 Dr Sueanne Emerald is leaving the practice in June. If you wish to continue care at this office, please call and schedule a transfer of care to one of the following providers: Dr Jacklin Mascot, MD,  Tino Foreman or Charanpreet Kaur, NP.  E2C2 please schedule TOC

## 2023-11-19 ENCOUNTER — Other Ambulatory Visit: Payer: Self-pay

## 2023-11-19 DIAGNOSIS — K219 Gastro-esophageal reflux disease without esophagitis: Secondary | ICD-10-CM

## 2023-11-19 DIAGNOSIS — R109 Unspecified abdominal pain: Secondary | ICD-10-CM

## 2023-11-19 DIAGNOSIS — F39 Unspecified mood [affective] disorder: Secondary | ICD-10-CM

## 2023-11-19 MED ORDER — BUPROPION HCL ER (XL) 150 MG PO TB24: 150.0000 mg | ORAL_TABLET | Freq: Every day | ORAL | 1 refills | Status: DC

## 2023-11-19 MED ORDER — OMEPRAZOLE 20 MG PO CPDR
DELAYED_RELEASE_CAPSULE | ORAL | 3 refills | Status: AC
Start: 2023-11-19 — End: ?

## 2023-11-19 MED ORDER — SERTRALINE HCL 100 MG PO TABS: ORAL_TABLET | ORAL | 1 refills | Status: DC

## 2023-11-19 NOTE — Telephone Encounter (Signed)
 Left message to return call to our office.  Need to find out which pharmacy pt get Sertraline  sent to.

## 2023-11-19 NOTE — Telephone Encounter (Signed)
 Left message to return call to our office.  To see which pharmacy pt uses.

## 2023-11-19 NOTE — Addendum Note (Signed)
 Addended by: Coran Dipaola on: 11/19/2023 04:58 PM   Modules accepted: Orders

## 2024-05-13 ENCOUNTER — Telehealth: Payer: Self-pay | Admitting: Internal Medicine

## 2024-05-13 ENCOUNTER — Other Ambulatory Visit: Payer: Self-pay | Admitting: Internal Medicine

## 2024-05-13 DIAGNOSIS — F39 Unspecified mood [affective] disorder: Secondary | ICD-10-CM

## 2024-05-13 NOTE — Telephone Encounter (Unsigned)
 Copied from CRM #[id number]. Topic: Clinical - Medication Refill >> May 13, 2024  8:27 AM Amber H wrote: Medication: sertraline  (ZOLOFT ) 100 MG tablet  and buPROPion  (WELLBUTRIN  XL) 150 MG 24 hr tablet   Has the patient contacted their pharmacy? No, patient has an appt with a new provider due to Dr. Hope leaving the practice. No appts until December the 1st and she is requesting a 5 day supply.  (Agent: If no, request that the patient contact the pharmacy for the refill. If patient does not wish to contact the pharmacy document the reason why and proceed with request.) (Agent: If yes, when and what did the pharmacy advise?)  [facility] [phone] [place] [address] [place] [address] Phone: [phone] Fax: [phone]  Is this the correct pharmacy for this prescription? Yes If no, delete pharmacy and type the correct one.   Has the prescription been filled recently? Yes in the past 30 days.   Is the patient out of the medication? Yes  Has the patient been seen for an appointment in the last year OR does the patient have an upcoming appointment? Yes  Can we respond through MyChart? Yes  Agent: Please be advised that Rx refills may take up to 3 business days. We ask that you follow-up with your pharmacy.

## 2024-05-13 NOTE — Telephone Encounter (Signed)
 Copied from CRM #[id number]. Topic: Clinical - Medication Refill >> May 13, 2024  8:37 AM Amber H wrote: Medication: buPROPion  (WELLBUTRIN  XL) 150 MG 24 hr tablet and sertraline  (ZOLOFT ) 100 MG tablet  Has the patient contacted their pharmacy? No, patient was seeing Dr. Hope however, she has left the practice.  (Agent: If no, request that the patient contact the pharmacy for the refill. If patient does not wish to contact the pharmacy document the reason why and proceed with request.) (Agent: If yes, when and what did the pharmacy advise?)   [facility] [phone] [place] [address] [place] [address] Phone: [phone] Fax: [phone]  Is this the correct pharmacy for this prescription? Yes If no, delete pharmacy and type the correct one.   Has the prescription been filled recently? Yes, patient confirmed in the past 30 days.   Is the patient out of the medication? Yes  Has the patient been seen for an appointment in the last year OR does the patient have an upcoming appointment? Yes  Can we respond through MyChart? Yes  Agent: Please be advised that Rx refills may take up to 3 business days. We ask that you follow-up with your pharmacy.

## 2024-05-14 MED ORDER — BUPROPION HCL ER (XL) 150 MG PO TB24: 150.0000 mg | ORAL_TABLET | Freq: Every day | ORAL | 0 refills | Status: DC

## 2024-05-14 MED ORDER — SERTRALINE HCL 100 MG PO TABS: ORAL_TABLET | ORAL | 0 refills | Status: DC

## 2024-05-19 ENCOUNTER — Ambulatory Visit: Payer: Self-pay | Admitting: Internal Medicine

## 2024-05-19 ENCOUNTER — Ambulatory Visit: Admitting: Internal Medicine

## 2024-05-19 ENCOUNTER — Other Ambulatory Visit: Payer: Self-pay | Admitting: Internal Medicine

## 2024-05-19 ENCOUNTER — Encounter: Payer: Self-pay | Admitting: Internal Medicine

## 2024-05-19 VITALS — BP 122/76 | HR 86 | Temp 98.8°F | Ht 63.0 in | Wt 136.0 lb

## 2024-05-19 DIAGNOSIS — E538 Deficiency of other specified B group vitamins: Secondary | ICD-10-CM

## 2024-05-19 DIAGNOSIS — R5383 Other fatigue: Secondary | ICD-10-CM | POA: Diagnosis not present

## 2024-05-19 DIAGNOSIS — E559 Vitamin D deficiency, unspecified: Secondary | ICD-10-CM

## 2024-05-19 DIAGNOSIS — F419 Anxiety disorder, unspecified: Secondary | ICD-10-CM

## 2024-05-19 DIAGNOSIS — K589 Irritable bowel syndrome without diarrhea: Secondary | ICD-10-CM

## 2024-05-19 DIAGNOSIS — Z1231 Encounter for screening mammogram for malignant neoplasm of breast: Secondary | ICD-10-CM

## 2024-05-19 DIAGNOSIS — R109 Unspecified abdominal pain: Secondary | ICD-10-CM

## 2024-05-19 DIAGNOSIS — F321 Major depressive disorder, single episode, moderate: Secondary | ICD-10-CM

## 2024-05-19 DIAGNOSIS — E785 Hyperlipidemia, unspecified: Secondary | ICD-10-CM

## 2024-05-19 DIAGNOSIS — Z0000 Encounter for general adult medical examination without abnormal findings: Secondary | ICD-10-CM

## 2024-05-19 DIAGNOSIS — J4520 Mild intermittent asthma, uncomplicated: Secondary | ICD-10-CM

## 2024-05-19 LAB — COMPREHENSIVE METABOLIC PANEL WITH GFR
ALT: 12 U/L (ref 0–35)
AST: 13 U/L (ref 0–37)
Albumin: 4.6 g/dL (ref 3.5–5.2)
Alkaline Phosphatase: 53 U/L (ref 39–117)
BUN: 15 mg/dL (ref 6–23)
CO2: 28 meq/L (ref 19–32)
Calcium: 9.6 mg/dL (ref 8.4–10.5)
Chloride: 101 meq/L (ref 96–112)
Creatinine, Ser: 0.73 mg/dL (ref 0.40–1.20)
GFR: 98.05 mL/min (ref >60.00)
Glucose, Bld: 97 mg/dL (ref 70–99)
Potassium: 4.4 meq/L (ref 3.5–5.1)
Sodium: 137 meq/L (ref 135–145)
Total Bilirubin: 0.5 mg/dL (ref 0.2–1.2)
Total Protein: 7.1 g/dL (ref 6.0–8.3)

## 2024-05-19 LAB — LIPID PANEL
Cholesterol: 265 mg/dL — ABNORMAL HIGH (ref 0–200)
HDL: 51 mg/dL (ref >39.00)
LDL Cholesterol: 184 mg/dL — ABNORMAL HIGH (ref 0–99)
NonHDL: 214.42
Total CHOL/HDL Ratio: 5
Triglycerides: 151 mg/dL — ABNORMAL HIGH (ref 0.0–149.0)
VLDL: 30.2 mg/dL (ref 0.0–40.0)

## 2024-05-19 LAB — CBC WITH DIFFERENTIAL/PLATELET
Basophils Absolute: 0 K/uL (ref 0.0–0.1)
Basophils Relative: 0.4 % (ref 0.0–3.0)
Eosinophils Absolute: 0.2 K/uL (ref 0.0–0.7)
Eosinophils Relative: 1.9 % (ref 0.0–5.0)
HCT: 38.1 % (ref 36.0–46.0)
Hemoglobin: 12.7 g/dL (ref 12.0–15.0)
Lymphocytes Relative: 23.3 % (ref 12.0–46.0)
Lymphs Abs: 2.3 K/uL (ref 0.7–4.0)
MCHC: 33.3 g/dL (ref 30.0–36.0)
MCV: 84.3 fl (ref 78.0–100.0)
Monocytes Absolute: 0.5 K/uL (ref 0.1–1.0)
Monocytes Relative: 5.4 % (ref 3.0–12.0)
Neutro Abs: 6.8 K/uL (ref 1.4–7.7)
Neutrophils Relative %: 69 % (ref 43.0–77.0)
Platelets: 243 K/uL (ref 150.0–400.0)
RBC: 4.52 Mil/uL (ref 3.87–5.11)
RDW: 13.6 % (ref 11.5–15.5)
WBC: 9.9 K/uL (ref 4.0–10.5)

## 2024-05-19 LAB — HEMOGLOBIN A1C: Hgb A1c MFr Bld: 5.5 % (ref 4.6–6.5)

## 2024-05-19 LAB — TSH: TSH: 1.87 u[IU]/mL (ref 0.35–5.50)

## 2024-05-19 LAB — VITAMIN D 25 HYDROXY (VIT D DEFICIENCY, FRACTURES): VITD: 28.27 ng/mL — ABNORMAL LOW (ref 30.00–100.00)

## 2024-05-19 MED ORDER — AIRSUPRA 90-80 MCG/ACT IN AERO: 2.0000 | INHALATION_SPRAY | Freq: Three times a day (TID) | RESPIRATORY_TRACT | 3 refills | Status: AC | PRN

## 2024-05-19 NOTE — Assessment & Plan Note (Signed)
-   This problem is chronic and stable -Patient uses an albuterol  inhaler as needed when she does have a flareup which is less than once a month.  During wintertime she does have more frequent flareups -Given that she does have a history of asthma she would benefit from a combined bronchodilator and steroid inhaler -Will prescribe Airsupra for the patient to use as needed for asthma flareups -No further workup at this time

## 2024-05-19 NOTE — Assessment & Plan Note (Signed)
-   This problem is chronic and stable -Last vitamin D  level was within normal limits -Will recheck vitamin D  level today -No further workup at this time

## 2024-05-19 NOTE — Assessment & Plan Note (Addendum)
-   Patient is overdue for mammogram screening -Order placed for mammogram today -Patient willing to get this done

## 2024-05-19 NOTE — Assessment & Plan Note (Signed)
-   Patient complaining of increased fatigue of uncertain etiology -She suspects that she may be perimenopausal  and this may be contributing to her fatigue -However, she does have a history of low normal vitamin B12 levels and may benefit from B12 supplementation -Will check B12, vitamin D , TSH as well as CBC for workup of her fatigue -No further workup at this time

## 2024-05-19 NOTE — Patient Instructions (Signed)
  VISIT SUMMARY: Today, you came in for medication refills, blood work, and to discuss your perimenopausal symptoms. We also reviewed your asthma, irritable bowel syndrome, and mood disturbances.  YOUR PLAN: -ASTHMA: Asthma is a condition where your airways narrow and swell, causing difficulty in breathing. You experience flare-ups a few times a year, especially during certain seasons. We have prescribed a combination inhaler with a steroid and bronchodilator to better manage your symptoms. This should help reduce the frequency and severity of your asthma attacks.  -IRRITABLE BOWEL SYNDROME (IBS): IBS is a disorder that affects your large intestine, causing symptoms like cramping, abdominal pain, bloating, gas, and diarrhea or constipation. Your symptoms are generally well-controlled with dicyclomine , but stress can trigger flare-ups. Continue taking dicyclomine  as prescribed. If your symptoms become more frequent, we will refer you to a gastroenterologist for further evaluation.  -DEPRESSION AND ANXIETY: Your depression and anxiety are well-managed with your current medications. You rarely need to use Ativan , which indicates good control of your symptoms. Continue with your current medication regimen.  -GENERAL HEALTH MAINTENANCE: We have ordered blood work for routine monitoring and will continue to manage your chronic conditions. Please maintain a healthy lifestyle, including regular exercise and a balanced diet.  INSTRUCTIONS: Please follow up with the blood work as ordered. If your IBS symptoms become more frequent, we will refer you to a gastroenterologist for further evaluation. Continue with your current medications for depression and anxiety, and use the new combination inhaler as prescribed for your asthma.                      Contains text generated by [employer].                                 Contains text generated by [employer].

## 2024-05-19 NOTE — Assessment & Plan Note (Signed)
-   Patient is a history of hyperlipidemia with last LDL greater than 190 -Will recheck lipid panel today -If LDL remains elevated greater than 190 she would benefit from initiation of statin -Will follow-up results and discussed with patient

## 2024-05-19 NOTE — Assessment & Plan Note (Signed)
-   Order placed for mammogram -Patient will obtain Pap smear at her gynecologist office -Will give a flu shot today

## 2024-05-19 NOTE — Assessment & Plan Note (Signed)
-   Patient with history of low normal vitamin B12 levels -Will recheck vitamin B12 level and if remains borderline would consider supplementation especially in the setting of fatigue -No further workup at this time

## 2024-05-19 NOTE — Assessment & Plan Note (Signed)
-   This problem is chronic and stable -Patient states she gets significant abdominal cramping which is managed with dicyclomine  -However, she does get flareups which are not controlled by dicyclomine  during times of increased stress -She would like referral to GI for further evaluation -Patient is referred to GI today

## 2024-05-19 NOTE — Progress Notes (Signed)
 Acute Office Visit  Subjective:     Patient ID: Veronica [name], female    DOB: 1976-05-10, 48 y.o.   MRN: [id number]  Chief Complaint  Patient presents with   Acute Visit    Med refills    Discussed the use of AI scribe software for clinical note transcription with the patient, who gave verbal consent to proceed.  History of Present Illness LOUCILE [name] is a 48 year old female who presents for medication refills, blood work, and evaluation of perimenopausal symptoms.  Perimenopausal symptoms - Exhaustion despite high activity level, including walking close to ten thousand steps and climbing approximately fifteen sets of stairs daily for work as an geophysicist/field seismologist principal - Hot flashes - Brain fog and difficulty focusing - Menstrual periods are nearly absent due to Mirena IUD, making assessment of menstrual changes difficult  Mood disturbances - Depression and anxiety managed with medication - Ativan  used less than once per month  Asthma symptoms - Seasonal asthma flare-ups, with recent exacerbation approximately two weeks ago and persistent symptoms last week - Coughing, chest tightness, and difficulty taking a full breath during flare-ups - Albuterol  used as needed for symptom relief - Previously used Singulair ; not currently on a steroid inhaler  Gastrointestinal symptoms - Irritable bowel syndrome managed with dicyclomine  - Occasional flare-ups, often triggered by stress - Medication provides partial symptom control    Review of Systems  Constitutional:  Positive for malaise/fatigue.  HENT: Negative.    Respiratory: Negative.    Cardiovascular: Negative.   Gastrointestinal:  Positive for abdominal pain.  Musculoskeletal: Negative.   Neurological: Negative.   Psychiatric/Behavioral: Negative.          Objective:    BP 122/76   Pulse 86   Temp 98.8 F (37.1 C)   Ht 5' 3 (1.6 m)   Wt 136 lb (61.7 kg)   SpO2 99%   BMI 24.09 kg/m    Physical  Exam Constitutional:      Appearance: Normal appearance.  HENT:     Head: Normocephalic and atraumatic.  Cardiovascular:     Rate and Rhythm: Normal rate and regular rhythm.     Heart sounds: Normal heart sounds.  Pulmonary:     Effort: Pulmonary effort is normal.     Breath sounds: Normal breath sounds. No wheezing, rhonchi or rales.  Abdominal:     General: Bowel sounds are normal. There is no distension.     Palpations: Abdomen is soft.     Tenderness: There is abdominal tenderness. There is no guarding or rebound.     Comments: Mild diffuse abdominal tenderness noted on palpation worse in the suprapubic and periumbilical regions  Musculoskeletal:        General: No swelling or tenderness.     Right lower leg: No edema.     Left lower leg: No edema.  Neurological:     Mental Status: She is alert.  Psychiatric:        Mood and Affect: Mood normal.        Behavior: Behavior normal.     No results found for any visits on 05/19/24.      Assessment & Plan:   Problem List Items Addressed This Visit       Respiratory   Asthma   - This problem is chronic and stable -Patient uses an albuterol  inhaler as needed when she does have a flareup which is less than once a month.  During wintertime she does have  more frequent flareups -Given that she does have a history of asthma she would benefit from a combined bronchodilator and steroid inhaler -Will prescribe Airsupra for the patient to use as needed for asthma flareups -No further workup at this time      Relevant Medications   Albuterol -Budesonide (AIRSUPRA) 90-80 MCG/ACT AERO     Digestive   Irritable bowel syndrome (IBS)   - This problem is chronic and stable -Patient states she gets significant abdominal cramping which is managed with dicyclomine  -However, she does get flareups which are not controlled by dicyclomine  during times of increased stress -She would like referral to GI for further evaluation -Patient is  referred to GI today      Relevant Orders   Ambulatory referral to Gastroenterology     Other   Annual physical exam   - Order placed for mammogram -Patient will obtain Pap smear at her gynecologist office -Will give a flu shot today      Anxiety   - This problem is chronic and stable -Patient is currently on sertraline  100 mg daily -States her symptoms are well-controlled with this medication -Patient's GAD-7 score today is 2 -Will continue with current medication for now      Breast cancer screening by mammogram   - Patient is overdue for mammogram screening -Order placed for mammogram today -Patient willing to get this done      Relevant Orders   MM 3D SCREENING MAMMOGRAM BILATERAL BREAST   Depression, major, single episode, moderate (HCC)   - Patient with a history of depression on sertraline  and Wellbutrin  -Patient's symptoms well-controlled with a PHQ 9 score of 4 today -She does complain of increased fatigue but I suspect is likely multifactorial and not just attributed to depression -Will continue with her current medications for now -No further workup at this time      Fatigue   - Patient complaining of increased fatigue of uncertain etiology -She suspects that she may be perimenopausal  and this may be contributing to her fatigue -However, she does have a history of low normal vitamin B12 levels and may benefit from B12 supplementation -Will check B12, vitamin D , TSH as well as CBC for workup of her fatigue -No further workup at this time      Relevant Orders   Comprehensive metabolic panel with GFR   CBC with Differential/Platelet   Hemoglobin A1c   TSH   Hyperlipidemia - Primary   - Patient is a history of hyperlipidemia with last LDL greater than 190 -Will recheck lipid panel today -If LDL remains elevated greater than 190 she would benefit from initiation of statin -Will follow-up results and discussed with patient      Relevant Orders    Comprehensive metabolic panel with GFR   Hemoglobin A1c   Lipid panel   Vitamin B 12 deficiency   - Patient with history of low normal vitamin B12 levels -Will recheck vitamin B12 level and if remains borderline would consider supplementation especially in the setting of fatigue -No further workup at this time      Relevant Orders   Vitamin B12   Vitamin D  deficiency   - This problem is chronic and stable -Last vitamin D  level was within normal limits -Will recheck vitamin D  level today -No further workup at this time      Relevant Orders   VITAMIN D  25 Hydroxy (Vit-D Deficiency, Fractures)    Meds ordered this encounter  Medications   Albuterol -Budesonide (AIRSUPRA)  90-80 MCG/ACT AERO    Sig: Inhale 2 puffs into the lungs 3 (three) times daily as needed.    Dispense:  10.7 g    Refill:  3    No follow-ups on file.  Adriane Guglielmo, MD

## 2024-05-19 NOTE — Assessment & Plan Note (Signed)
-   Patient with a history of depression on sertraline  and Wellbutrin  -Patient's symptoms well-controlled with a PHQ 9 score of 4 today -She does complain of increased fatigue but I suspect is likely multifactorial and not just attributed to depression -Will continue with her current medications for now -No further workup at this time

## 2024-05-19 NOTE — Assessment & Plan Note (Signed)
-   This problem is chronic and stable -Patient is currently on sertraline  100 mg daily -States her symptoms are well-controlled with this medication -Patient's GAD-7 score today is 2 -Will continue with current medication for now

## 2024-05-20 ENCOUNTER — Ambulatory Visit (INDEPENDENT_AMBULATORY_CARE_PROVIDER_SITE_OTHER)

## 2024-05-20 DIAGNOSIS — E538 Deficiency of other specified B group vitamins: Secondary | ICD-10-CM | POA: Diagnosis not present

## 2024-05-20 LAB — VITAMIN B12: Vitamin B-12: 176 pg/mL — ABNORMAL LOW (ref 211–911)

## 2024-05-21 MED ORDER — VITAMIN D (ERGOCALCIFEROL) 1.25 MG (50000 UNIT) PO CAPS: 50000.0000 [IU] | ORAL_CAPSULE | ORAL | 0 refills | Status: AC

## 2024-05-21 MED ORDER — VITAMIN B-12 1000 MCG PO TABS: 1000.0000 ug | ORAL_TABLET | Freq: Every day | ORAL | 1 refills | Status: AC

## 2024-05-23 MED ORDER — ROSUVASTATIN CALCIUM 20 MG PO TABS: 20.0000 mg | ORAL_TABLET | Freq: Every day | ORAL | 3 refills | Status: AC

## 2024-05-23 NOTE — Addendum Note (Signed)
 Addended by: Jalal Rauch on: 05/23/2024 03:25 PM   Modules accepted: Orders

## 2024-07-06 ENCOUNTER — Other Ambulatory Visit: Payer: Self-pay | Admitting: Internal Medicine

## 2024-07-06 ENCOUNTER — Other Ambulatory Visit: Payer: Self-pay | Admitting: Medical Genetics

## 2024-07-06 DIAGNOSIS — F39 Unspecified mood [affective] disorder: Secondary | ICD-10-CM

## 2024-07-08 ENCOUNTER — Ambulatory Visit
Admission: RE | Admit: 2024-07-08 | Discharge: 2024-07-08 | Disposition: A | Discharge status: Home / Self Care | Payer: Self-pay | Source: Ambulatory Visit | Attending: Medical Genetics | Admitting: Medical Genetics

## 2024-07-08 ENCOUNTER — Ambulatory Visit
Admission: RE | Admit: 2024-07-08 | Discharge: 2024-07-08 | Disposition: A | Discharge status: Home / Self Care | Payer: Self-pay | Source: Ambulatory Visit | Attending: Internal Medicine | Admitting: Internal Medicine

## 2024-07-08 DIAGNOSIS — Z006 Encounter for examination for normal comparison and control in clinical research program: Secondary | ICD-10-CM | POA: Insufficient documentation

## 2024-07-08 DIAGNOSIS — Z1231 Encounter for screening mammogram for malignant neoplasm of breast: Secondary | ICD-10-CM | POA: Insufficient documentation

## 2024-07-11 ENCOUNTER — Other Ambulatory Visit: Payer: Self-pay | Admitting: Internal Medicine

## 2024-07-11 DIAGNOSIS — R928 Other abnormal and inconclusive findings on diagnostic imaging of breast: Secondary | ICD-10-CM

## 2024-07-16 ENCOUNTER — Ambulatory Visit
Admission: RE | Admit: 2024-07-16 | Discharge: 2024-07-16 | Disposition: A | Discharge status: Home / Self Care | Source: Ambulatory Visit | Attending: Internal Medicine | Admitting: Internal Medicine

## 2024-07-16 DIAGNOSIS — R928 Other abnormal and inconclusive findings on diagnostic imaging of breast: Secondary | ICD-10-CM | POA: Diagnosis present

## 2024-07-17 ENCOUNTER — Ambulatory Visit: Payer: Self-pay | Admitting: Internal Medicine

## 2024-07-17 NOTE — Progress Notes (Signed)
 Noted.

## 2024-07-18 LAB — GENECONNECT MOLECULAR SCREEN: Genetic Analysis Overall Interpretation: NEGATIVE
